# Patient Record
Sex: Male | Born: 1965 | Race: White | Hispanic: No | Smoking: Never smoker
Health system: Southern US, Community
[De-identification: ages and names within clinical notes are randomized; demographics above are authoritative.]

## PROBLEM LIST (undated history)

## (undated) DIAGNOSIS — G2581 Restless legs syndrome: Secondary | ICD-10-CM

## (undated) DIAGNOSIS — M1990 Unspecified osteoarthritis, unspecified site: Secondary | ICD-10-CM

## (undated) DIAGNOSIS — F988 Other specified behavioral and emotional disorders with onset usually occurring in childhood and adolescence: Secondary | ICD-10-CM

## (undated) DIAGNOSIS — G609 Hereditary and idiopathic neuropathy, unspecified: Secondary | ICD-10-CM

## (undated) DIAGNOSIS — I10 Essential (primary) hypertension: Secondary | ICD-10-CM

## (undated) DIAGNOSIS — I498 Other specified cardiac arrhythmias: Secondary | ICD-10-CM

## (undated) DIAGNOSIS — R748 Abnormal levels of other serum enzymes: Secondary | ICD-10-CM

## (undated) DIAGNOSIS — K219 Gastro-esophageal reflux disease without esophagitis: Secondary | ICD-10-CM

## (undated) DIAGNOSIS — M797 Fibromyalgia: Secondary | ICD-10-CM

## (undated) DIAGNOSIS — F32A Depression, unspecified: Secondary | ICD-10-CM

## (undated) DIAGNOSIS — I471 Supraventricular tachycardia, unspecified: Secondary | ICD-10-CM

## (undated) DIAGNOSIS — Z800 Family history of malignant neoplasm of digestive organs: Secondary | ICD-10-CM

## (undated) DIAGNOSIS — R002 Palpitations: Secondary | ICD-10-CM

## (undated) DIAGNOSIS — K589 Irritable bowel syndrome without diarrhea: Secondary | ICD-10-CM

## (undated) DIAGNOSIS — E291 Testicular hypofunction: Secondary | ICD-10-CM

## (undated) DIAGNOSIS — G43909 Migraine, unspecified, not intractable, without status migrainosus: Secondary | ICD-10-CM

## (undated) DIAGNOSIS — G4700 Insomnia, unspecified: Secondary | ICD-10-CM

## (undated) DIAGNOSIS — E785 Hyperlipidemia, unspecified: Secondary | ICD-10-CM

## (undated) DIAGNOSIS — M549 Dorsalgia, unspecified: Secondary | ICD-10-CM

## (undated) HISTORY — DX: Abnormal levels of other serum enzymes: R74.8

## (undated) HISTORY — PX: LIPOMA RESECTION: SHX23

## (undated) HISTORY — DX: Dorsalgia, unspecified: M54.9

## (undated) HISTORY — DX: Migraine, unspecified, not intractable, without status migrainosus: G43.909

## (undated) HISTORY — DX: Hyperlipidemia, unspecified: E78.5

## (undated) HISTORY — DX: Palpitations: R00.2

## (undated) HISTORY — DX: Fibromyalgia: M79.7

## (undated) HISTORY — DX: Depression, unspecified: F32.A

## (undated) HISTORY — DX: Family history of malignant neoplasm of digestive organs: Z80.0

## (undated) HISTORY — DX: Unspecified osteoarthritis, unspecified site: M19.90

## (undated) HISTORY — DX: Hereditary and idiopathic neuropathy, unspecified: G60.9

## (undated) HISTORY — DX: Testicular hypofunction: E29.1

## (undated) HISTORY — DX: Gastro-esophageal reflux disease without esophagitis: K21.9

## (undated) HISTORY — DX: Supraventricular tachycardia: I47.1

## (undated) HISTORY — DX: Supraventricular tachycardia, unspecified: I47.10

## (undated) HISTORY — DX: Restless legs syndrome: G25.81

## (undated) HISTORY — DX: Essential (primary) hypertension: I10

## (undated) HISTORY — DX: Other specified behavioral and emotional disorders with onset usually occurring in childhood and adolescence: F98.8

## (undated) HISTORY — DX: Other specified cardiac arrhythmias: I49.8

## (undated) HISTORY — DX: Irritable bowel syndrome, unspecified: K58.9

## (undated) HISTORY — DX: Insomnia, unspecified: G47.00

---

## 1999-11-26 ENCOUNTER — Ambulatory Visit (HOSPITAL_COMMUNITY): Admission: RE | Admit: 1999-11-26 | Discharge: 1999-11-26 | Payer: Self-pay | Admitting: Surgery

## 2009-08-10 HISTORY — PX: COLONOSCOPY: SHX174

## 2017-04-24 HISTORY — PX: COLONOSCOPY: SHX174

## 2018-05-15 ENCOUNTER — Ambulatory Visit (INDEPENDENT_AMBULATORY_CARE_PROVIDER_SITE_OTHER)
Admission: RE | Admit: 2018-05-15 | Discharge: 2018-05-15 | Disposition: A | Discharge status: Home / Self Care | Payer: 59 | Source: Ambulatory Visit | Attending: Pulmonary Disease | Admitting: Pulmonary Disease

## 2018-05-15 ENCOUNTER — Ambulatory Visit (INDEPENDENT_AMBULATORY_CARE_PROVIDER_SITE_OTHER): Payer: 59 | Admitting: Pulmonary Disease

## 2018-05-15 ENCOUNTER — Encounter: Payer: Self-pay | Admitting: Pulmonary Disease

## 2018-05-15 VITALS — BP 124/80 | HR 81 | Ht 72.0 in | Wt 243.4 lb

## 2018-05-15 DIAGNOSIS — R05 Cough: Secondary | ICD-10-CM

## 2018-05-15 DIAGNOSIS — R053 Chronic cough: Secondary | ICD-10-CM

## 2018-05-15 MED ORDER — HYDROCODONE-HOMATROPINE 5-1.5 MG/5ML PO SYRP: 5.0000 mL | ORAL_SOLUTION | Freq: Four times a day (QID) | ORAL | 0 refills | Status: DC | PRN

## 2018-05-15 MED ORDER — ONDANSETRON HCL 4 MG PO TABS: 4.0000 mg | ORAL_TABLET | Freq: Three times a day (TID) | ORAL | 0 refills | Status: DC | PRN

## 2018-05-15 NOTE — Patient Instructions (Addendum)
Chronic Cough -CXR today -STOP Lisinopril x 1 month -Follow-up with PCP in 2 weeks for blood pressure check -We will need to schedule Pulmonary Function Test. Please contact our office for the best date for your work schedule -Cough syrup as needed -Zofran as needed for nausea related to cough syrup  Allergies -Take Flonase 1 spray per nare daily

## 2018-05-15 NOTE — Progress Notes (Signed)
Subjective:   PATIENT ID: Jack Lawson GENDER: male DOB: 03-Sep-1965, MRN: [id number]   HPI  Chief Complaint  Patient presents with  . Consult    Consult from Dr. Nedra Hai for chronic cough. He states he had bronchitis 1 year ago, 5 rounds of antibiotics and the cough has remained. States he does have some throat irritation and has occassional mucous production.     Reason for Visit: New consult for chronic cough  Mr. Linvel Schwenker is a 53 year old male never smoker with atrial arrhythmias, hypertension, migraine who presents for chronic cough.  He has had chronic cough for over a year and has been treated with multiple rounds of antibiotics (at least 5) without improvement. He initially had symptoms of bronchitis. His cough is mainly dry and hacking in character and nonproductive and associated with sore throat. He feels an internal irritation that stimulates his cough. He noticed after laughing he would have severe coughing fits. In the last few months cough will occur without laughing. When laying down, cough will worsen. It interferes with his sleep. Has tried over the counter medications and tessalon perles. Triggered by cold air, activity, bending over, laying over. Symptoms only improve with time. Reports allergies all year-round and taking claritin. Has post nasal drip. Reports reflux which is well-controlled on omeprazole daily. Denies chest tightness and wheezing.   Hx of DVT/PE in mom and aunts.   Reviewed Cardiology 02/17/17: Followed for palpitations and started on Flecainide. 48Hr Holter 06/10/17: Focal atrial tachycardia  Social History: Never smoker. Second hand smoke during childhood.    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 Stopped 1 year ago Home is 52 years old  I have personally reviewed patient's past medical/family/social history, allergies, current medications.  Past Medical History:  Diagnosis Date  . Atrial  arrhythmia   . HTN (hypertension)   . Hyperlipidemia      Family History  Problem Relation Age of Onset  . Deep vein thrombosis Mother   . Deep vein thrombosis Maternal Aunt      Social History   Occupational History  . Not on file  Tobacco Use  . Smoking status: Never Smoker  . Smokeless tobacco: Never Used  Substance and Sexual Activity  . Alcohol use: Not on file  . Drug use: Not on file  . Sexual activity: Not on file    Allergies  Allergen Reactions  . Codeine Nausea And Vomiting     Outpatient Medications Prior to Visit  Medication Sig Dispense Refill  . benzonatate (TESSALON) 200 MG capsule     . buPROPion (WELLBUTRIN XL) 150 MG 24 hr tablet Take by mouth.    . gabapentin (NEURONTIN) 300 MG capsule     . lisinopril (PRINIVIL,ZESTRIL) 10 MG tablet     . Loratadine 10 MG CAPS Take by mouth.    . Melatonin 5 MG TABS Take by mouth.    . meloxicam (MOBIC) 7.5 MG tablet     . Multiple Vitamin (MULTIVITAMIN) tablet Take 1 tablet by mouth daily.    Marland Kitchen omeprazole (PRILOSEC) 40 MG capsule Take by mouth.    . verapamil (CALAN-SR) 180 MG CR tablet TAKE ONE TABLET BY MOUTH EVERY DAY     No facility-administered medications prior to visit.     Review of Systems  Constitutional: Negative for chills, diaphoresis, fever, malaise/fatigue and weight loss.  HENT: Negative for congestion, ear pain and sore throat.   Respiratory: Positive for cough. Negative  for hemoptysis, sputum production, shortness of breath and wheezing.   Cardiovascular: Negative for chest pain, palpitations and leg swelling.  Gastrointestinal: Negative for abdominal pain, heartburn and nausea.  Genitourinary: Negative for frequency.  Musculoskeletal: Negative for joint pain and myalgias.  Skin: Negative for itching and rash.  Neurological: Negative for dizziness, weakness and headaches.  Endo/Heme/Allergies: Positive for environmental allergies. Does not bruise/bleed easily.  Psychiatric/Behavioral:  Negative for depression. The patient is not nervous/anxious.      Objective:   Vitals:   05/15/18 1441  BP: 124/80  Pulse: 81  SpO2: 100%  Weight: 243 lb 6.4 oz (110.4 kg)  Height: 6' (1.829 m)   SpO2: 100 %  Physical Exam: General: Well-appearing, no acute distress HENT: [place], AT, OP clear, MMM Eyes: EOMI, no scleral icterus Respiratory: Clear to auscultation bilaterally.  No crackles, wheezing or rales Cardiovascular: irregularly irregular rhythme, -M/R/G, no JVD GI: BS+, soft, nontender Extremities:-Edema,-tenderness Neuro: AAO x4, CNII-XII grossly intact Skin: Intact, no rashes or bruising Psych: Normal mood, normal affect  Data Reviewed:  Imaging: CXR 05/15/18-No acute infiltrate, effusion or edema.  PFT: None available  Labs: None available  Imaging, labs and tests noted above have been reviewed independently by me.    Assessment & Plan:   Discussion: 53 year old male with chronic cough x1 year refractory to over-the-counter medications and antibiotics.  Currently on lisinopril.  Will consider UACS and uncontrolled reflux.  Will definitively rule out obstructive or restrictive lung process with PFTs.  Chronic cough -CXR today. Reviewed as noted above. Overall unremarkable -STOP Lisinopril x 1 month -Follow-up with PCP in 2 weeks for blood pressure check -We will need to schedule Pulmonary Function Test. Please contact our office for the best date for your work schedule -Cough syrup as needed -Zofran as needed for nausea related to cough syrup  Allergies -Take Flonase 1 spray per nare daily   Health Maintenance Pneumonia Not Due Influenza Due. Will discuss at next visit.  Orders Placed This Encounter  Procedures  . DG Chest 2 View    Standing Status:   Future    Number of Occurrences:   1    Standing Expiration Date:   07/14/2019    Order Specific Question:   Reason for Exam (SYMPTOM  OR DIAGNOSIS REQUIRED)    Answer:   cough    Order Specific  Question:   Preferred imaging location?    Answer:   Internal    Order Specific Question:   Radiology Contrast Protocol - do NOT remove file path    Answer:   [email or web address]  . Pulmonary function test    Standing Status:   Future    Standing Expiration Date:   05/16/2019    Order Specific Question:   Where should this test be performed?    Answer:   [facility]    Order Specific Question:   Full PFT: includes the following: basic spirometry, spirometry pre & post bronchodilator, diffusion capacity (DLCO), lung volumes    Answer:   Full PFT   Meds ordered this encounter  Medications  . HYDROcodone-homatropine (HYCODAN) 5-1.5 MG/5ML syrup    Sig: Take 5 mLs by mouth every 6 (six) hours as needed for cough.    Dispense:  240 mL    Refill:  0  . ondansetron (ZOFRAN) 4 MG tablet    Sig: Take 1 tablet (4 mg total) by mouth every 8 (eight) hours as needed for nausea or vomiting.    Dispense:  20 tablet  Refill:  0    Return in about 1 month (around 06/13/2018).  Merlene Dante Mechele Collin, MD  Pulmonary Critical Care 05/16/2018 2:21 PM  Office Number [phone]

## 2018-05-16 ENCOUNTER — Encounter: Payer: Self-pay | Admitting: Pulmonary Disease

## 2018-05-16 DIAGNOSIS — I498 Other specified cardiac arrhythmias: Secondary | ICD-10-CM | POA: Insufficient documentation

## 2018-05-16 DIAGNOSIS — G43909 Migraine, unspecified, not intractable, without status migrainosus: Secondary | ICD-10-CM | POA: Insufficient documentation

## 2018-05-16 DIAGNOSIS — R05 Cough: Secondary | ICD-10-CM | POA: Insufficient documentation

## 2018-05-16 DIAGNOSIS — I10 Essential (primary) hypertension: Secondary | ICD-10-CM | POA: Insufficient documentation

## 2018-05-16 DIAGNOSIS — R053 Chronic cough: Secondary | ICD-10-CM | POA: Insufficient documentation

## 2018-05-19 ENCOUNTER — Telehealth: Payer: Self-pay

## 2018-05-19 NOTE — Telephone Encounter (Signed)
-----   Message from [name], MD sent at 05/16/2018  2:21 PM EST ----- Regarding: follow-up Schedule follow-up appt in 1 month Jack Lawson

## 2018-05-19 NOTE — Telephone Encounter (Signed)
Attempted to call patient to schedule follow up appointment per JE. I was unable to reach patient. Left message on mobile phone number provided. Called home number provided, unable to reach patient and unable to leave message as phone stated that memory was full. Will call back.

## 2018-05-20 ENCOUNTER — Telehealth: Payer: Self-pay | Admitting: Pulmonary Disease

## 2018-05-20 NOTE — Telephone Encounter (Signed)
Luciano Cutter, MD  P [facility] Triage Pool        Schedule follow-up appt in 1 month  JE   ------------------------------ Spoke with pt. Attempted to schedule the pt for his 1 ROV and PFT. We did not have any available appointments with [name] until later this month. The pt became very angry and stated, "If I knew she was going to never be in the office I would have chosen another physician." Pt requests to be moved to a physician who is going to be here every day.  [name] - please advise if you are okay with the pt switching to [name].  [name] - please advise if you are okay with the pt switching to you.

## 2018-05-20 NOTE — Telephone Encounter (Signed)
ATC, NA and VM full  WCB 

## 2018-05-20 NOTE — Telephone Encounter (Signed)
Please see telephone encounter from 05/20/2018.

## 2018-05-20 NOTE — Telephone Encounter (Signed)
Fine with me but would rec before he sees me make sure has given prior instructions a try most importantly stopping the lisinopril and when returns do so with all active meds in hand including otcs

## 2018-05-21 NOTE — Telephone Encounter (Signed)
That is fine with me.

## 2018-05-21 NOTE — Telephone Encounter (Signed)
Called patient, unable to reach. Unable to leave voicemail as memory was full. Will call back.

## 2018-05-26 NOTE — Telephone Encounter (Signed)
Patient returned call Spoke with patient and appt scheduled with MW on 3.11.2020 @ 1045, new patient 30 min appt Patient would like to have PFT done but no availability prior to appt with MW until later this week PFT scheduled for same day 3.11.2020 @ 1600  Nothing further needed; will sign off

## 2018-05-27 ENCOUNTER — Ambulatory Visit: Payer: 59 | Admitting: Internal Medicine

## 2018-05-31 DIAGNOSIS — E871 Hypo-osmolality and hyponatremia: Secondary | ICD-10-CM | POA: Diagnosis not present

## 2018-05-31 DIAGNOSIS — J189 Pneumonia, unspecified organism: Secondary | ICD-10-CM | POA: Diagnosis not present

## 2018-05-31 DIAGNOSIS — J101 Influenza due to other identified influenza virus with other respiratory manifestations: Secondary | ICD-10-CM | POA: Diagnosis not present

## 2018-05-31 DIAGNOSIS — J9691 Respiratory failure, unspecified with hypoxia: Secondary | ICD-10-CM | POA: Diagnosis not present

## 2018-06-04 ENCOUNTER — Encounter (HOSPITAL_COMMUNITY): Payer: Self-pay | Admitting: Emergency Medicine

## 2018-06-04 ENCOUNTER — Other Ambulatory Visit: Payer: Self-pay

## 2018-06-04 ENCOUNTER — Observation Stay (HOSPITAL_COMMUNITY)
Admission: EM | Admit: 2018-06-04 | Discharge: 2018-06-07 | Disposition: A | Discharge status: Home / Self Care | Payer: 59 | Attending: Family Medicine | Admitting: Family Medicine

## 2018-06-04 ENCOUNTER — Emergency Department (HOSPITAL_COMMUNITY): Payer: 59

## 2018-06-04 DIAGNOSIS — J189 Pneumonia, unspecified organism: Principal | ICD-10-CM | POA: Insufficient documentation

## 2018-06-04 DIAGNOSIS — R590 Localized enlarged lymph nodes: Secondary | ICD-10-CM | POA: Insufficient documentation

## 2018-06-04 DIAGNOSIS — Z79899 Other long term (current) drug therapy: Secondary | ICD-10-CM | POA: Insufficient documentation

## 2018-06-04 DIAGNOSIS — Z885 Allergy status to narcotic agent status: Secondary | ICD-10-CM | POA: Insufficient documentation

## 2018-06-04 DIAGNOSIS — N189 Chronic kidney disease, unspecified: Secondary | ICD-10-CM | POA: Diagnosis not present

## 2018-06-04 DIAGNOSIS — Z8249 Family history of ischemic heart disease and other diseases of the circulatory system: Secondary | ICD-10-CM | POA: Diagnosis not present

## 2018-06-04 DIAGNOSIS — M797 Fibromyalgia: Secondary | ICD-10-CM | POA: Insufficient documentation

## 2018-06-04 DIAGNOSIS — G43709 Chronic migraine without aura, not intractable, without status migrainosus: Secondary | ICD-10-CM

## 2018-06-04 DIAGNOSIS — G43909 Migraine, unspecified, not intractable, without status migrainosus: Secondary | ICD-10-CM | POA: Insufficient documentation

## 2018-06-04 DIAGNOSIS — I129 Hypertensive chronic kidney disease with stage 1 through stage 4 chronic kidney disease, or unspecified chronic kidney disease: Secondary | ICD-10-CM | POA: Insufficient documentation

## 2018-06-04 DIAGNOSIS — G4700 Insomnia, unspecified: Secondary | ICD-10-CM | POA: Diagnosis not present

## 2018-06-04 DIAGNOSIS — R05 Cough: Secondary | ICD-10-CM

## 2018-06-04 DIAGNOSIS — J181 Lobar pneumonia, unspecified organism: Secondary | ICD-10-CM

## 2018-06-04 DIAGNOSIS — Z791 Long term (current) use of non-steroidal anti-inflammatories (NSAID): Secondary | ICD-10-CM | POA: Diagnosis not present

## 2018-06-04 DIAGNOSIS — F329 Major depressive disorder, single episode, unspecified: Secondary | ICD-10-CM | POA: Insufficient documentation

## 2018-06-04 DIAGNOSIS — R000 Tachycardia, unspecified: Secondary | ICD-10-CM | POA: Diagnosis not present

## 2018-06-04 DIAGNOSIS — K219 Gastro-esophageal reflux disease without esophagitis: Secondary | ICD-10-CM | POA: Diagnosis not present

## 2018-06-04 DIAGNOSIS — N179 Acute kidney failure, unspecified: Secondary | ICD-10-CM | POA: Insufficient documentation

## 2018-06-04 LAB — CBC WITH DIFFERENTIAL/PLATELET
Abs Immature Granulocytes: 0.09 10*3/uL — ABNORMAL HIGH (ref 0.00–0.07)
BASOS PCT: 0 %
Basophils Absolute: 0 10*3/uL (ref 0.0–0.1)
EOS ABS: 0.4 10*3/uL (ref 0.0–0.5)
EOS PCT: 3 %
HCT: 39.8 % (ref 39.0–52.0)
Hemoglobin: 12.9 g/dL — ABNORMAL LOW (ref 13.0–17.0)
Immature Granulocytes: 1 %
Lymphocytes Relative: 24 %
Lymphs Abs: 2.8 10*3/uL (ref 0.7–4.0)
MCH: 29.3 pg (ref 26.0–34.0)
MCHC: 32.4 g/dL (ref 30.0–36.0)
MCV: 90.5 fL (ref 80.0–100.0)
Monocytes Absolute: 1.4 10*3/uL — ABNORMAL HIGH (ref 0.1–1.0)
Monocytes Relative: 12 %
Neutro Abs: 7.2 10*3/uL (ref 1.7–7.7)
Neutrophils Relative %: 60 %
Platelets: 287 10*3/uL (ref 150–400)
RBC: 4.4 MIL/uL (ref 4.22–5.81)
RDW: 12.3 % (ref 11.5–15.5)
WBC: 11.9 10*3/uL — ABNORMAL HIGH (ref 4.0–10.5)
nRBC: 0 % (ref 0.0–0.2)

## 2018-06-04 LAB — BASIC METABOLIC PANEL
ANION GAP: 10 (ref 5–15)
BUN: 14 mg/dL (ref 6–20)
CO2: 25 mmol/L (ref 22–32)
Calcium: 8.6 mg/dL — ABNORMAL LOW (ref 8.9–10.3)
Chloride: 100 mmol/L (ref 98–111)
Creatinine, Ser: 1.35 mg/dL — ABNORMAL HIGH (ref 0.61–1.24)
GFR calc Af Amer: >60 mL/min (ref >60)
GFR calc non Af Amer: >60 mL/min (ref >60)
Glucose, Bld: 73 mg/dL (ref 70–99)
POTASSIUM: 4.2 mmol/L (ref 3.5–5.1)
Sodium: 135 mmol/L (ref 135–145)

## 2018-06-04 LAB — BRAIN NATRIURETIC PEPTIDE: B Natriuretic Peptide: 33.1 pg/mL (ref 0.0–100.0)

## 2018-06-04 MED ORDER — BUPROPION HCL ER (XL) 150 MG PO TB24
150.0000 mg | ORAL_TABLET | Freq: Every day | ORAL | Status: DC
  Administered 2018-06-05 – 2018-06-07 (×3): 150 mg via ORAL
  Filled 2018-06-04 (×3): qty 1

## 2018-06-04 MED ORDER — BENZONATATE 100 MG PO CAPS
200.0000 mg | ORAL_CAPSULE | Freq: Three times a day (TID) | ORAL | Status: DC
  Filled 2018-06-04 (×2): qty 2

## 2018-06-04 MED ORDER — GABAPENTIN 300 MG PO CAPS
300.0000 mg | ORAL_CAPSULE | Freq: Two times a day (BID) | ORAL | Status: DC
  Administered 2018-06-04 – 2018-06-07 (×5): 300 mg via ORAL
  Filled 2018-06-04 (×5): qty 1

## 2018-06-04 MED ORDER — MELATONIN 3 MG PO TABS
4.5000 mg | ORAL_TABLET | Freq: Every evening | ORAL | Status: DC | PRN
  Filled 2018-06-04: qty 1.5

## 2018-06-04 MED ORDER — SODIUM CHLORIDE 0.9 % IV SOLN: 500.0000 mg | INTRAVENOUS | Status: DC

## 2018-06-04 MED ORDER — PANTOPRAZOLE SODIUM 40 MG PO TBEC
40.0000 mg | DELAYED_RELEASE_TABLET | Freq: Every day | ORAL | Status: DC
  Administered 2018-06-05 – 2018-06-07 (×3): 40 mg via ORAL
  Filled 2018-06-04 (×4): qty 1

## 2018-06-04 MED ORDER — ENOXAPARIN SODIUM 40 MG/0.4ML ~~LOC~~ SOLN
40.0000 mg | SUBCUTANEOUS | Status: DC
  Administered 2018-06-04 – 2018-06-06 (×3): 40 mg via SUBCUTANEOUS
  Filled 2018-06-04 (×3): qty 0.4

## 2018-06-04 MED ORDER — ONDANSETRON HCL 4 MG PO TABS
4.0000 mg | ORAL_TABLET | Freq: Three times a day (TID) | ORAL | Status: DC | PRN
  Administered 2018-06-05 – 2018-06-07 (×4): 4 mg via ORAL
  Filled 2018-06-04 (×4): qty 1

## 2018-06-04 MED ORDER — SODIUM CHLORIDE 0.9 % IV SOLN
500.0000 mg | Freq: Once | INTRAVENOUS | Status: AC
  Administered 2018-06-04: 500 mg via INTRAVENOUS
  Filled 2018-06-04: qty 500

## 2018-06-04 MED ORDER — SODIUM CHLORIDE 0.9 % IV SOLN: 1.0000 g | INTRAVENOUS | Status: DC

## 2018-06-04 MED ORDER — NAPROXEN 250 MG PO TABS
250.0000 mg | ORAL_TABLET | Freq: Once | ORAL | Status: AC
  Administered 2018-06-04: 250 mg via ORAL
  Filled 2018-06-04: qty 1

## 2018-06-04 MED ORDER — TRAZODONE HCL 100 MG PO TABS
100.0000 mg | ORAL_TABLET | Freq: Every day | ORAL | Status: DC
  Administered 2018-06-04 – 2018-06-06 (×3): 100 mg via ORAL
  Filled 2018-06-04 (×3): qty 1

## 2018-06-04 MED ORDER — FUROSEMIDE 10 MG/ML IJ SOLN
20.0000 mg | Freq: Once | INTRAMUSCULAR | Status: AC
  Administered 2018-06-04: 20 mg via INTRAVENOUS
  Filled 2018-06-04: qty 2

## 2018-06-04 MED ORDER — VERAPAMIL HCL ER 180 MG PO TBCR
180.0000 mg | EXTENDED_RELEASE_TABLET | Freq: Every day | ORAL | Status: DC
  Administered 2018-06-05 – 2018-06-07 (×3): 180 mg via ORAL
  Filled 2018-06-04 (×3): qty 1

## 2018-06-04 MED ORDER — SODIUM CHLORIDE 0.9 % IV SOLN
1.0000 g | Freq: Once | INTRAVENOUS | Status: AC
  Administered 2018-06-04: 1 g via INTRAVENOUS
  Filled 2018-06-04: qty 10

## 2018-06-04 MED ORDER — LORATADINE 10 MG PO TABS
10.0000 mg | ORAL_TABLET | Freq: Every day | ORAL | Status: DC
  Administered 2018-06-05 – 2018-06-07 (×3): 10 mg via ORAL
  Filled 2018-06-04 (×3): qty 1

## 2018-06-04 MED ORDER — HYDROCODONE-HOMATROPINE 5-1.5 MG/5ML PO SYRP
5.0000 mL | ORAL_SOLUTION | Freq: Four times a day (QID) | ORAL | Status: DC | PRN
  Administered 2018-06-04: 5 mL via ORAL
  Filled 2018-06-04: qty 5

## 2018-06-04 NOTE — ED Notes (Signed)
ED TO INPATIENT HANDOFF REPORT  ED Nurse Name and Phone #: [phone]  S Name/Age/Gender Jack Lawson 53 y.o. male Room/Bed: 038C/038C  Code Status   Code Status: Not on file  Home/SNF/Other Home Patient oriented to: self, place, time and situation Is this baseline? Yes   Triage Complete: Triage complete  Chief Complaint Pneumonia  Triage Note Pt states he was recently treated for the flu. Pt also was diagnosed with PNA. Pt was quarantined and tested for coronavirus which was negative. Pt went back to [facility] and was told the PNA was in both lungs. Pt presents to ED today for worsening PNA.    Allergies Allergies  Allergen Reactions  . Codeine Nausea And Vomiting    Level of Care/Admitting Diagnosis ED Disposition    ED Disposition Condition Comment   Admit  Hospital Area: [facility] [[id number]]  Level of Care: Med-Surg [16]  Diagnosis: Pneumonia [227785]  Admitting Physician: Moses Manners [[id number]]  Attending Physician: Moses Manners [[id number]]  Estimated length of stay: past midnight tomorrow  Certification:: I certify this patient will need inpatient services for at least 2 midnights  PT Class (Do Not Modify): Inpatient [101]  PT Acc Code (Do Not Modify): Private [1]       B Medical/Surgery History Past Medical History:  Diagnosis Date  . Atrial arrhythmia   . HTN (hypertension)   . Hyperlipidemia    History reviewed. No pertinent surgical history.   A IV Location/Drains/Wounds Patient Lines/Drains/Airways Status   Active Line/Drains/Airways    Name:   Placement date:   Placement time:   Site:   Days:   Peripheral IV 06/04/18 Right Forearm   06/04/18    1734    Forearm   less than 1          Intake/Output Last 24 hours No intake or output data in the 24 hours ending 06/04/18 1954  Labs/Imaging Results for orders placed or performed during the hospital encounter of 06/04/18 (from the past 48 hour(s))  CBC with Differential      Status: Abnormal   Collection Time: 06/04/18  6:37 PM  Result Value Ref Range   WBC 11.9 (H) 4.0 - 10.5 K/uL   RBC 4.40 4.22 - 5.81 MIL/uL   Hemoglobin 12.9 (L) 13.0 - 17.0 g/dL   HCT 76.7 34.1 - 93.7 %   MCV 90.5 80.0 - 100.0 fL   MCH 29.3 26.0 - 34.0 pg   MCHC 32.4 30.0 - 36.0 g/dL   RDW 90.2 40.9 - 73.5 %   Platelets 287 150 - 400 K/uL   nRBC 0.0 0.0 - 0.2 %   Neutrophils Relative % 60 %   Neutro Abs 7.2 1.7 - 7.7 K/uL   Lymphocytes Relative 24 %   Lymphs Abs 2.8 0.7 - 4.0 K/uL   Monocytes Relative 12 %   Monocytes Absolute 1.4 (H) 0.1 - 1.0 K/uL   Eosinophils Relative 3 %   Eosinophils Absolute 0.4 0.0 - 0.5 K/uL   Basophils Relative 0 %   Basophils Absolute 0.0 0.0 - 0.1 K/uL   Immature Granulocytes 1 %   Abs Immature Granulocytes 0.09 (H) 0.00 - 0.07 K/uL    Comment: Performed at [facility], [address] [place], [place] [address]  Basic metabolic panel     Status: Abnormal   Collection Time: 06/04/18  6:37 PM  Result Value Ref Range   Sodium 135 135 - 145 mmol/L   Potassium 4.2  3.5 - 5.1 mmol/L   Chloride 100 98 - 111 mmol/L   CO2 25 22 - 32 mmol/L   Glucose, Bld 73 70 - 99 mg/dL   BUN 14 6 - 20 mg/dL   Creatinine, Ser 5.62 (H) 0.61 - 1.24 mg/dL   Calcium 8.6 (L) 8.9 - 10.3 mg/dL   GFR calc non Af Amer >60 >60 mL/min   GFR calc Af Amer >60 >60 mL/min   Anion gap 10 5 - 15    Comment: Performed at [facility], [address], [place], [place] [address]   Dg Chest Portable 1 View  Result Date: 06/04/2018 CLINICAL DATA:  Question pneumonia, prior abnormal radiograph EXAM: PORTABLE CHEST 1 VIEW COMPARISON:  05/31/2018 FINDINGS: Patchy interstitial and alveolar infiltrate at the left base. Stable cardiomegaly central vascular congestion. No pneumothorax. IMPRESSION: 1. Cardiomegaly with vascular congestion. 2. Patchy interstitial and alveolar infiltrate at the left base Electronically Signed   By: Jasmine Pang M.D.   On: 06/04/2018 18:17    Pending  Labs Unresulted Labs (From admission, onward)    Start     Ordered   06/04/18 1842  Brain natriuretic peptide  Once,   R     06/04/18 1841          Vitals/Pain Today's Vitals   06/04/18 1727 06/04/18 1745 06/04/18 1930 06/04/18 1930  BP: (!) 154/114 125/82    Pulse:  98 (!) 105   Resp: (!) 21 (!) 21 (!) 25   Temp: 98.9 F (37.2 C)     TempSrc: Oral     SpO2: 98% 98% 97%   Weight:      Height:      PainSc:    0-No pain    Isolation Precautions No active isolations  Medications Medications  azithromycin (ZITHROMAX) 500 mg in sodium chloride 0.9 % 250 mL IVPB (500 mg Intravenous New Bag/Given 06/04/18 1939)  cefTRIAXone (ROCEPHIN) 1 g in sodium chloride 0.9 % 100 mL IVPB (0 g Intravenous Stopped 06/04/18 1930)    Mobility walks Low fall risk   Focused Assessments Pulmonary Assessment Handoff:  Lung sounds:   O2 Device: Room Air        R Recommendations: See Admitting Provider Note  Report given to:   Additional Notes:

## 2018-06-04 NOTE — H&P (Addendum)
Family Medicine Teaching [facility] Admission History and Physical Service Pager: [phone]  Patient name: Jack Lawson Medical record number: [id number] Date of birth: 09-Nov-1965 Age: 53 y.o. Gender: male  Primary Care Provider: [name], MD Consultants: None Code Status: Full  Chief Complaint: Cough, shortness of breath, worsening pneumonia  Assessment and Plan: Jack Lawson is a 52 y.o. male presenting with shortness of breath and cough. PMH is significant for migraines, sinus tachycardia, chronic cough, depression, and fibromyalgia.  Shortness of breath and Cough: Patient with history of unresolved shortness of breath and dry cough in setting of pneumonia (s/p outpatient antibiotic treatment with doxycycline and levaquin), recent positive flu swab (3/10), and new onset lower extremity swelling.  In the ED patient is satting well on room air but appears uncomfortable due to work of breathing.  Minimal crackles can be auscultated in the bilateral lung bases left greater than right, but most notably he has bilateral lower extremity 2+ pitting edema to the knees. He is afebrile, but has leukocytosis at 11.9.  Chest x-ray on admission showing cardiomegaly with vascular congestion and patchy interstitial and alveolar infiltrate at the left base.  The patient's shortness of breath and cough could be due to worsening pneumonia unresponsive to prior antibiotic treatments.  Cough is nonproductive. May have a component of bronchitis. Due to the lower extremity swelling a new onset CHF cannot be excluded, however, BNP is normal at 33.1 on admission.  PE is unlikely as well score is only 1.5 (due to patient's chronic tachycardia), however the patient does have a family history of DVTs.  No wheezes were appreciated and the patient does not have a smoking history so new onset COPD versus asthma are unlikely.  COVID testing 3/15 was negative and the patient has other explanations for his symptoms  (pneumonia for fever, lisinopril for chronic cough). -Admit to med-surg, attending Dr. Pollie Meyer -Continue IV azithromycin and ceftriaxone -Furosemide injection 20 mg IV x1 -consider chest CT if lack of improvement -Consider steroid if no improvement -A.m. echo -A.m. CBC, BMP -Follow-up strep pneumoniae urinary antigen -Up with assistance -Vital signs per routine -Zofran PRN nausea  AKI: Patient with creatinine 1.35 on admission, BUN within normal limits of 14 and GFR greater than 60. -Monitor with a.m. BMP - patient receiving Lasix -Avoid nephrotoxic meds  Hypertension: Patient has a history of hypertension for which he used to take lisinopril; however, lisinopril was discontinued in light of cough. Blood pressures on admission 125/82 up to 154/114. -Continue to monitor blood pressure with vitals -Blood pressures may reduce with Lasix IV -Consider adding on losartan for high blood pressure  Sinus tachycardia: no underlying cause, was diagnosed 20 years ago, takes verapamil 180 mg daily. -Continue home verapamil 180 mg daily -echo as above  Chronic cough: had for 1 year, recently stopped lisinopril March 1st. Cough continued but was getting sick at this time and diagnosed with flu. Tested negative for COVID. Has a history of GERD on PPI. No wheezing on exam. -Tessalon Perles 20 mg TID for cough -Hydrocodone-homatropine q6 hours PRN cough -Continue home Loratadine 10 mg -continue PPI  Depression: sees primary care physician, takes Wellbutrin 150 mg daily.  Does not see psychiatrist/psychologist/counselor. -Continue Wellbutrin 150 mg daily  Migraines: last had migraine 6 months ago, normally takes aleve PRN -Naproxen as needed for migraines  Fibromyalgia: diagnosed 3 Weeks ago, prescribed gabapentin 300 mg twice daily for pain -Continue gabapentin  Insomnia: patient takes melatonin 10 mg each night for sleep.  Reports poor sleep since getting sick and requests stronger sleep  medication -Continue melatonin PRN -Trazodone 100 mg at bedtime for sleep  GERD: Patient has a history of such, might be contributing to chronic cough.  Takes omeprazole at home. -Protonix 40 mg daily  FEN/GI: Heart healthy diet Prophylaxis: Lovenox  Disposition: Admit to med-surg  History of Present Illness:  Jack Lawson is a 53 y.o. male presenting with shortness of breath, dry cough, and tiredness, due to PNA.  He has a history of chronic cough possibly secondary to lisinopril use for hypertension.  At the end of February he had flown from [place] to [place] and back, then presented to a pulmonologist 2/28 to be worked up for (chonic) cough. Lisinopril was stopped. There were plans to do PFTs but patient became ill. He tested positive for flu 3/10 and started Tamiflu 3/12. He was also prescribed doxycycline for flu/pneumonia.  On 3/14, without improvement of his symptoms, the patient went to the [facility] emergency department for about 12 hours, at which point chest x-ray was positive for pneumonia, he was given 1 dose of IV azithromycin, doxycycline was stopped, the patient was sent home on levofloxacin. He was tested for covid-19 at this ED visit which was negative. The patient symptoms continue to get worse and the patient decided to come to the [facility], ED for continued shortness of breath and cough. He reports he cannot lay flat, he has to sit upright in a recliner to sleep. His leg swelling started 3-4 days ago. He has had normal urinary output. His cough is non-productive. He has some minor improvement with codeine cough syrup.   Review Of Systems: Per HPI with the following additions:  Review of Systems  Constitutional: Positive for fever and malaise/fatigue.  Respiratory: Positive for cough and shortness of breath. Negative for sputum production and wheezing.   Cardiovascular: Positive for leg swelling (2+ pitting). Negative for chest pain and palpitations.   Gastrointestinal: Negative for abdominal pain, nausea and vomiting.  Genitourinary: Negative for dysuria.   Patient Active Problem List   Diagnosis Date Noted  . Pneumonia 06/04/2018  . Atrial arrhythmia 05/16/2018  . Hypertension 05/16/2018  . Migraine 05/16/2018  . Chronic cough 05/16/2018   Past Medical History: Past Medical History:  Diagnosis Date  . Atrial arrhythmia   . HTN (hypertension)   . Hyperlipidemia    Past Surgical History: History reviewed. No pertinent surgical history.  Social History: Social History   Tobacco Use  . Smoking status: Never Smoker  . Smokeless tobacco: Never Used  Substance Use Topics  . Alcohol use: Not Currently  . Drug use: Never   Additional social history: None  Please also refer to relevant sections of EMR.  Family History: Family History  Problem Relation Age of Onset  . Deep vein thrombosis Mother   . Deep vein thrombosis Maternal Aunt   Uncle - Diabetes HTN - Everyone on mom's side  Allergies and Medications: Allergies  Allergen Reactions  . Codeine Nausea And Vomiting   No current facility-administered medications on file prior to encounter.    Current Outpatient Medications on File Prior to Encounter  Medication Sig Dispense Refill  . benzonatate (TESSALON) 200 MG capsule     . buPROPion (WELLBUTRIN XL) 150 MG 24 hr tablet Take by mouth.    . gabapentin (NEURONTIN) 300 MG capsule     . HYDROcodone-homatropine (HYCODAN) 5-1.5 MG/5ML syrup Take 5 mLs by mouth every 6 (six) hours as needed  for cough. 240 mL 0  . lisinopril (PRINIVIL,ZESTRIL) 10 MG tablet     . Loratadine 10 MG CAPS Take by mouth.    . Melatonin 5 MG TABS Take by mouth.    . meloxicam (MOBIC) 7.5 MG tablet     . Multiple Vitamin (MULTIVITAMIN) tablet Take 1 tablet by mouth daily.    Marland Kitchen omeprazole (PRILOSEC) 40 MG capsule Take by mouth.    . ondansetron (ZOFRAN) 4 MG tablet Take 1 tablet (4 mg total) by mouth every 8 (eight) hours as needed for  nausea or vomiting. 20 tablet 0  . verapamil (CALAN-SR) 180 MG CR tablet TAKE ONE TABLET BY MOUTH EVERY DAY     Objective: BP 125/82   Pulse (!) 105   Temp 98.9 F (37.2 C) (Oral)   Resp (!) 25   Ht 6' (1.829 m)   Wt 108.9 kg   SpO2 97%   BMI 32.55 kg/m  Physical Exam HENT:     Nose: No congestion.  Neck:     Musculoskeletal: Normal range of motion.  Cardiovascular:     Rate and Rhythm: Regular rhythm. Tachycardia present.     Pulses: Normal pulses.     Heart sounds: Normal heart sounds.  Pulmonary:     Effort: No respiratory distress.     Breath sounds: No wheezing.     Comments: Bilateral lower lobe crackles Chest:     Chest wall: No tenderness.  Abdominal:     General: Bowel sounds are normal.  Musculoskeletal:        General: Swelling (2+ bilateral pitting up to knees) present.     Right lower leg: Edema present.     Left lower leg: Edema present.  Lymphadenopathy:     Cervical: No cervical adenopathy.  Neurological:     General: No focal deficit present.     Mental Status: He is alert.   Labs and Imaging: CBC BMET  Recent Labs  Lab 06/04/18 1837  WBC 11.9*  HGB 12.9*  HCT 39.8  PLT 287   Recent Labs  Lab 06/04/18 1837  NA 135  K 4.2  CL 100  CO2 25  BUN 14  CREATININE 1.35*  GLUCOSE 73  CALCIUM 8.6*     BNP: 33.1 wnl Strep pneumoniae urine antigen: Pending  Dg Chest Portable 1 View: 06/04/2018 CLINICAL DATA:  Question pneumonia, prior abnormal radiograph EXAM: PORTABLE CHEST 1 VIEW COMPARISON:  05/31/2018 FINDINGS: Patchy interstitial and alveolar infiltrate at the left base. Stable cardiomegaly central vascular congestion. No pneumothorax. IMPRESSION: 1. Cardiomegaly with vascular congestion. 2. Patchy interstitial and alveolar infiltrate at the left base.  Dollene Cleveland, DO 06/04/2018, 7:44 PM PGY-1, [facility] FPTS Intern pager: [phone], text pages welcome   FPTS Upper-Level Resident Addendum   I have  independently interviewed and examined the patient. I have discussed the above with the original author and agree with their documentation. My edits for correction/addition/clarification are in pink. Please see also any attending notes.    Dolores Patty, DO PGY-3, [facility] FPTS Service pager: [phone] (text pages welcome through AMION)

## 2018-06-04 NOTE — ED Notes (Addendum)
Spoke with DO Dareen Piano, who states they are not testing COVID-19 at this time.

## 2018-06-04 NOTE — ED Triage Notes (Signed)
Pt states he was recently treated for the flu. Pt also was diagnosed with PNA. Pt was quarantined and tested for coronavirus which was negative. Pt went back to MD and was told the PNA was in both lungs. Pt presents to ED today for worsening PNA.

## 2018-06-04 NOTE — ED Notes (Signed)
RN attempted to call report 

## 2018-06-04 NOTE — ED Provider Notes (Signed)
[facility] EMERGENCY DEPARTMENT Provider Note   CSN: [id number] Arrival date & time: 06/04/18  1712    History   Chief Complaint Chief Complaint  Patient presents with  . Pneumonia    HPI Jack Lawson is a 53 y.o. male presents today for concern about outpatient treatment of community-acquired pneumonia.  Patient reports that on May 26, 2018 he developed fever and cough.  He tested positive for influenza as well as chest x-ray concerning for pneumonia.  He started on antibiotics, symptoms continue to worsen and patient found to now have bilateral lower lobe pneumonia on chest x-ray at Urgent Care.  Patient presents today for further evaluation and treatment.  Patient reports that 3 weeks ago he traveled via [employer] to [place].  Patient was tested on Sunday for novel COVID-19 virus by [facility], informed today that his testing was negative. - On arrival patient reports intermittent fever, cough productive with yellow sputum and intermittent shortness of breath only with cough.  Patient concerned with bilateral lower leg swelling.  States this is a new problem for him over the past 5 days.  No injury or color change, no associated leg pain.  No history of CHF.   Personal protective equipment was worn throughout visit    HPI  Past Medical History:  Diagnosis Date  . Atrial arrhythmia   . HTN (hypertension)   . Hyperlipidemia     Patient Active Problem List   Diagnosis Date Noted  . Atrial arrhythmia 05/16/2018  . Hypertension 05/16/2018  . Migraine 05/16/2018  . Chronic cough 05/16/2018    History reviewed. No pertinent surgical history.      Home Medications    Prior to Admission medications   Medication Sig Start Date End Date Taking? Authorizing Provider  benzonatate (TESSALON) 200 MG capsule  05/05/18   [provider]  buPROPion (WELLBUTRIN XL) 150 MG 24 hr tablet Take by mouth. 10/19/15   [provider]  gabapentin (NEURONTIN) 300 MG capsule  05/05/18   [provider]  HYDROcodone-homatropine (HYCODAN) 5-1.5 MG/5ML syrup Take 5 mLs by mouth every 6 (six) hours as needed for cough. 05/15/18   Luciano Cutter, MD  lisinopril (PRINIVIL,ZESTRIL) 10 MG tablet  03/23/18   [provider]  Loratadine 10 MG CAPS Take by mouth.    [provider]  Melatonin 5 MG TABS Take by mouth.    [provider]  meloxicam (MOBIC) 7.5 MG tablet  04/20/18   [provider]  Multiple Vitamin (MULTIVITAMIN) tablet Take 1 tablet by mouth daily.    [provider]  omeprazole (PRILOSEC) 40 MG capsule Take by mouth. 09/22/15   [provider]  ondansetron (ZOFRAN) 4 MG tablet Take 1 tablet (4 mg total) by mouth every 8 (eight) hours as needed for nausea or vomiting. 05/15/18   Luciano Cutter, MD  verapamil (CALAN-SR) 180 MG CR tablet TAKE ONE TABLET BY MOUTH EVERY DAY 05/15/18   [provider]    Family History Family History  Problem Relation Age of Onset  . Deep vein thrombosis Mother   . Deep vein thrombosis Maternal Aunt     Social History Social History   Tobacco Use  . Smoking status: Never Smoker  . Smokeless tobacco: Never Used  Substance Use Topics  . Alcohol use: Not Currently  . Drug use: Never     Allergies   Codeine   Review of Systems Review of Systems  Constitutional: Positive for chills and fever.  Respiratory: Positive for cough and shortness of breath (Only with cough fits).   Cardiovascular: Positive for leg swelling. Negative for chest pain and palpitations.  Gastrointestinal: Positive for nausea. Negative for abdominal pain, diarrhea and vomiting.  Musculoskeletal: Negative.  Negative for arthralgias, myalgias, neck pain and neck stiffness.  Skin: Negative.  Negative for color change and wound.  Neurological: Negative.  Negative for weakness and headaches.  All other systems reviewed and are  negative.  Physical Exam Updated Vital Signs BP 125/82   Pulse 98   Temp 98.9 F (37.2 C) (Oral)   Resp (!) 21   Ht 6' (1.829 m)   Wt 108.9 kg   SpO2 98%   BMI 32.55 kg/m   Physical Exam Constitutional:      General: He is not in acute distress.    Appearance: Normal appearance. He is well-developed. He is obese. He is not ill-appearing or diaphoretic.  HENT:     Head: Normocephalic and atraumatic.     Right Ear: External ear normal.     Left Ear: External ear normal.     Nose: Nose normal.     Mouth/Throat:     Mouth: Mucous membranes are moist.     Pharynx: Oropharynx is clear.  Eyes:     General: Vision grossly intact. Gaze aligned appropriately.     Pupils: Pupils are equal, round, and reactive to light.  Neck:     Musculoskeletal: Normal range of motion.     Trachea: Trachea and phonation normal. No tracheal deviation.  Cardiovascular:     Rate and Rhythm: Normal rate and regular rhythm.     Pulses:          Dorsalis pedis pulses are 2+ on the right side and 2+ on the left side.       Posterior tibial pulses are 2+ on the right side and 2+ on the left side.     Heart sounds: Normal heart sounds.  Pulmonary:     Effort: Pulmonary effort is normal. No respiratory distress.     Breath sounds: Examination of the left-middle field reveals rhonchi. Examination of the left-lower field reveals rhonchi. Wheezing and rhonchi present.  Chest:     Chest wall: No deformity, tenderness or crepitus.  Abdominal:     General: There is no distension.     Palpations: Abdomen is soft.     Tenderness: There is no abdominal tenderness. There is no guarding or rebound.  Musculoskeletal: Normal range of motion.        General: No tenderness.     Right lower leg: 1+ Edema present.     Left lower leg: 1+ Edema present.  Feet:     Right foot:     Protective Sensation: 5 sites tested. 5 sites sensed.     Left foot:     Protective Sensation: 5 sites tested. 5 sites sensed.  Skin:     General: Skin is warm and dry.     Capillary Refill: Capillary refill takes less than 2 seconds.  Neurological:     General: No focal deficit present.     Mental Status: He is alert.     GCS: GCS eye subscore is 4. GCS verbal subscore is 5. GCS motor subscore is 6.     Comments: Speech is clear and goal oriented, follows commands Major Cranial nerves without deficit, no facial droop Moves extremities without ataxia, coordination intact  Psychiatric:  Mood and Affect: Mood normal.        Behavior: Behavior normal.      ED Treatments / Results  Labs (all labs ordered are listed, but only abnormal results are displayed) Labs Reviewed  CBC WITH DIFFERENTIAL/PLATELET  BASIC METABOLIC PANEL    EKG None  Radiology Dg Chest Portable 1 View  Result Date: 06/04/2018 CLINICAL DATA:  Question pneumonia, prior abnormal radiograph EXAM: PORTABLE CHEST 1 VIEW COMPARISON:  05/31/2018 FINDINGS: Patchy interstitial and alveolar infiltrate at the left base. Stable cardiomegaly central vascular congestion. No pneumothorax. IMPRESSION: 1. Cardiomegaly with vascular congestion. 2. Patchy interstitial and alveolar infiltrate at the left base Electronically Signed   By: Jasmine Pang M.D.   On: 06/04/2018 18:17    Procedures Procedures (including critical care time)  Medications Ordered in ED Medications  cefTRIAXone (ROCEPHIN) 1 g in sodium chloride 0.9 % 100 mL IVPB (has no administration in time range)  azithromycin (ZITHROMAX) 500 mg in sodium chloride 0.9 % 250 mL IVPB (has no administration in time range)     Initial Impression / Assessment and Plan / ED Course  I have reviewed the triage vital signs and the nursing notes.  Pertinent labs & imaging results that were available during my care of the patient were reviewed by me and considered in my medical decision making (see chart for details).  Clinical Course as of Jun 04 2003  Thu Jun 04, 2018  [id number]   [BM]  1845  Discussed case with family medicine will see patient in ED for evaluation.   [BM]    Clinical Course User Index [BM] Harlene Salts A, PA-C      CBC with leukocytosis BMP with mildly elevated creatinine BNP within normal limits CXR:  IMPRESSION:  1. Cardiomegaly with vascular congestion.  2. Patchy interstitial and alveolar infiltrate at the left base  - Vital signs stable, mildly tachycardic.  Lung examination concerning for left lower rhonchi, mild bilateral wheezing.  Patient to be admitted for failed outpatient treatment of community-acquired pneumonia.  Azithromycin and Rocephin ordered. - Patient with negative COVID-19 test today, attempted to contact number and clinical course to discuss this testing with [facility] health, no answer. - Discussed case with admitting team patient has been accepted to their service for further evaluation treatment.  Case discussed with Dr. Dalene Seltzer who agrees with work-up and admission at this time.  Full personal protective equipment was worn during my encounter with this patient.  Note: Portions of this report may have been transcribed using voice recognition software. Every effort was made to ensure accuracy; however, inadvertent computerized transcription errors may still be present. Final Clinical Impressions(s) / ED Diagnoses   Final diagnoses:  Community acquired bilateral lower lobe pneumonia [facility])    ED Discharge Orders    None       Elizabeth Palau 06/04/18 2104    Alvira Monday, MD 06/05/18 [phone]

## 2018-06-05 ENCOUNTER — Other Ambulatory Visit: Payer: Self-pay

## 2018-06-05 ENCOUNTER — Inpatient Hospital Stay (HOSPITAL_COMMUNITY): Payer: 59

## 2018-06-05 DIAGNOSIS — I340 Nonrheumatic mitral (valve) insufficiency: Secondary | ICD-10-CM

## 2018-06-05 LAB — ECHOCARDIOGRAM COMPLETE
HEIGHTINCHES: 72 in
Weight: 3523.83 oz

## 2018-06-05 LAB — BASIC METABOLIC PANEL
Anion gap: 8 (ref 5–15)
BUN: 14 mg/dL (ref 6–20)
CALCIUM: 8.1 mg/dL — AB (ref 8.9–10.3)
CO2: 25 mmol/L (ref 22–32)
Chloride: 102 mmol/L (ref 98–111)
Creatinine, Ser: 1.36 mg/dL — ABNORMAL HIGH (ref 0.61–1.24)
GFR calc Af Amer: >60 mL/min (ref >60)
GFR calc non Af Amer: 60 mL/min — ABNORMAL LOW (ref >60)
Glucose, Bld: 103 mg/dL — ABNORMAL HIGH (ref 70–99)
Potassium: 3.8 mmol/L (ref 3.5–5.1)
Sodium: 135 mmol/L (ref 135–145)

## 2018-06-05 LAB — HIV ANTIBODY (ROUTINE TESTING W REFLEX): HIV Screen 4th Generation wRfx: NONREACTIVE

## 2018-06-05 LAB — CBC
HCT: 35.3 % — ABNORMAL LOW (ref 39.0–52.0)
Hemoglobin: 11.3 g/dL — ABNORMAL LOW (ref 13.0–17.0)
MCH: 28.3 pg (ref 26.0–34.0)
MCHC: 32 g/dL (ref 30.0–36.0)
MCV: 88.5 fL (ref 80.0–100.0)
Platelets: 302 10*3/uL (ref 150–400)
RBC: 3.99 MIL/uL — ABNORMAL LOW (ref 4.22–5.81)
RDW: 12.3 % (ref 11.5–15.5)
WBC: 10.4 10*3/uL (ref 4.0–10.5)
nRBC: 0 % (ref 0.0–0.2)

## 2018-06-05 LAB — STREP PNEUMONIAE URINARY ANTIGEN: Strep Pneumo Urinary Antigen: NEGATIVE

## 2018-06-05 LAB — TROPONIN I: Troponin I: <0.03 ng/mL (ref <0.03)

## 2018-06-05 LAB — MRSA PCR SCREENING: MRSA by PCR: NEGATIVE

## 2018-06-05 MED ORDER — HYDROCOD POLST-CPM POLST ER 10-8 MG/5ML PO SUER
5.0000 mL | Freq: Two times a day (BID) | ORAL | Status: DC | PRN
  Administered 2018-06-05 – 2018-06-06 (×3): 5 mL via ORAL
  Filled 2018-06-05 (×3): qty 5

## 2018-06-05 MED ORDER — SODIUM CHLORIDE 0.9 % IV SOLN
2.0000 g | Freq: Three times a day (TID) | INTRAVENOUS | Status: DC
  Administered 2018-06-05 – 2018-06-07 (×6): 2 g via INTRAVENOUS
  Filled 2018-06-05 (×7): qty 2

## 2018-06-05 MED ORDER — BUTALBITAL-APAP-CAFFEINE 50-325-40 MG PO TABS
1.0000 | ORAL_TABLET | Freq: Once | ORAL | Status: AC
  Administered 2018-06-05: 1 via ORAL
  Filled 2018-06-05: qty 1

## 2018-06-05 MED ORDER — ACETAMINOPHEN 500 MG PO TABS
500.0000 mg | ORAL_TABLET | Freq: Four times a day (QID) | ORAL | Status: DC | PRN
  Administered 2018-06-06 – 2018-06-07 (×2): 500 mg via ORAL
  Filled 2018-06-05 (×2): qty 1

## 2018-06-05 MED ORDER — GUAIFENESIN-CODEINE 100-10 MG/5ML PO SOLN
5.0000 mL | ORAL | Status: DC | PRN
  Filled 2018-06-05: qty 5

## 2018-06-05 MED ORDER — VANCOMYCIN HCL 10 G IV SOLR
2500.0000 mg | Freq: Once | INTRAVENOUS | Status: AC
  Administered 2018-06-05: 2500 mg via INTRAVENOUS
  Filled 2018-06-05: qty 2500

## 2018-06-05 MED ORDER — VANCOMYCIN HCL IN DEXTROSE 750-5 MG/150ML-% IV SOLN
750.0000 mg | Freq: Three times a day (TID) | INTRAVENOUS | Status: DC
  Administered 2018-06-05 – 2018-06-06 (×2): 750 mg via INTRAVENOUS
  Filled 2018-06-05 (×3): qty 150

## 2018-06-05 NOTE — Progress Notes (Addendum)
Pharmacy Antibiotic Note  Jack Lawson is a 53 y.o. male admitted on 06/04/2018 with pneumonia.  Pharmacy has been consulted for Vancomycin / Cefepime dosing. Patient has presented with cough and SOB. Patient was on zithromax and ceftriaxone now being escalated to cefepime / vancomycin. Patient's Scr stable at 1.36 past two days. Patient's WBC slightly down from 11.9 to 10.4 today. Patient is afebrile today.   Plan: - Discontinue Azithromycin and Ceftriaxone - Start Vancomycin 2500mg  x1 for loading dose then Vancomycin 750 mg IV Q 8 hrs.  Goal AUC 400-550. Expected AUC: 497 SCr used: 1.36 - Start Cefepime 2g every 8 hours - follow-up signs/symptoms of infection, C/S, LOT, renal function -f/u MRSA PCR for de-escalation of Vancomyicn  Height: 6' (182.9 cm) Weight: 220 lb 3.8 oz (99.9 kg) IBW/kg (Calculated) : 77.6  Temp (24hrs), Avg:98.8 F (37.1 C), Min:97.5 F (36.4 C), Max:99.9 F (37.7 C)  Recent Labs  Lab 06/04/18 1837 06/05/18 0240  WBC 11.9* 10.4  CREATININE 1.35* 1.36*    Estimated Creatinine Clearance: 78.6 mL/min (A) (by C-G formula based on SCr of 1.36 mg/dL (H)).    Allergies  Allergen Reactions  . Codeine Nausea And Vomiting    Antimicrobials this admission: 3/19 Ceftriaxone >> 3/20 3/19 Azithromycin >> 3/20 3/20 Vancomycin >> 3/20 Cefepime >>  Dose adjustments this admission: N/a  Microbiology results: 3/15 COVID Test Negative from OSH. 3/20 MRSA PCR    Thank you for allowing pharmacy to be a part of this patient's care.  Bradley Ferris, PharmD 06/05/2018 10:51 AM PGY-1 Pharmacy Resident Direct Phone: [phone] Please check [email or web address] for unit-specific pharmacist phone numbers

## 2018-06-05 NOTE — Progress Notes (Signed)
Family Medicine Teaching Service Daily Progress Note Intern Pager: [phone]  Patient name: Jack Lawson Medical record number: [id number] Date of birth: 08-07-1965 Age: 53 y.o. Gender: male  Primary Care Provider: [name], MD Consultants: none Code Status: Full  Pt Overview and Major Events to Date:  3/19-admission for pneumonia  Assessment and Plan: Jack Lawson is a 53 y.o. male presenting with shortness of breath and cough. PMH is significant for migraines, sinus tachycardia, chronic cough, depression, and fibromyalgia.  Community-acquired pneumonia Currently unclear if this is unresolved pneumonia or possibly a new incident of pneumonia following a flu infection.  Afebrile overnight, tachypnea up to 25, tachycardic to 105.  White blood cell count 10.4 down from 11.9 on admission.  Strep pneumoniae urinary antigen negative. -S/p azithromycin x1 -S/p ceftriaxone x1 -Consider clindamycin/vancomycin for MRSA coverage and pneumonia following influenza infection  Heart failure with unknown ejection fraction No known history of heart failure.  2.1 L urine output after IV Lasix 20 mg x 1.  Appears mildly fluid overloaded on exam. This morning he continues to endorse significant chest congestion with mild shortness of breath.  He reports that he feels that his edema is decreased.  Difficult to assess JVD, 1+ LE edema bilaterally. -Consider IV Lasix 20 mg twice daily -Monitor I's and O's -Daily weights -Follow-up echo  Possible CKD Unclear baseline creatinine.  Creatinine on admission 1.3.  Creatinine stable at 1.3. -Continue to monitor  Hypertension No new blood pressures since H&P.  No home medications -Monitor  Sinus tachycardia, chronic -Continue verapamil 180 mg daily -Follow-up echocardiogram  Chronic cough -Tessalon Perles -Loratadine daily -Hycodan cough syrup  Depression -We will [place] 150 mg daily  Migraines -Tylenol PRN  Insomnia -Continue  melatonin -Continue trazodone 100 mg nightly  GERD -Protonix 40 mg daily  FEN/GI: Heart healthy PPx: lovenox  Disposition: Possible DC 3/21  Subjective:  Feels mildly improved compared to admission.  He continues to notice chest congestion.  He notes that his swelling has decreased compared to admission.  Objective: Temp:  [98.8 F (37.1 C)-99.9 F (37.7 C)] 98.8 F (37.1 C) (03/19 2326) Pulse Rate:  [96-105] 96 (03/19 2326) Resp:  [16-25] 20 (03/19 2326) BP: (125-154)/(80-114) 142/82 (03/19 2326) SpO2:  [97 %-100 %] 100 % (03/19 2326) Weight:  [99.9 kg-108.9 kg] 99.9 kg (03/19 2151)  General: Alert and cooperative and appears to be in no acute distress HEENT: Neck non-tender without lymphadenopathy, masses or thyromegaly Cardio: Normal S1 and S2, no S3 or S4. Rhythm is regular. No murmurs or rubs.   Pulm: Clear to auscultation bilaterally, no crackles, wheezing, or diminished breath sounds. Normal respiratory effort Abdomen: Bowel sounds normal. Abdomen soft and non-tender.  Extremities: 2+ lower extremity edema bilaterally.  Radial pulse. Neuro: Cranial nerves grossly intact  Laboratory: Recent Labs  Lab 06/04/18 1837 06/05/18 0240  WBC 11.9* 10.4  HGB 12.9* 11.3*  HCT 39.8 35.3*  PLT 287 302   Recent Labs  Lab 06/04/18 1837 06/05/18 0240  NA 135 135  K 4.2 3.8  CL 100 102  CO2 25 25  BUN 14 14  CREATININE 1.35* 1.36*  CALCIUM 8.6* 8.1*  GLUCOSE 73 103*    Imaging/Diagnostic Tests: Dg Chest 2 View  Result Date: 05/16/2018 CLINICAL DATA:  Chronic cough. EXAM: CHEST - 2 VIEW COMPARISON:  02/20/2017 FINDINGS: Lungs are clear. Heart size and mediastinal contours are within normal limits. No effusion. Visualized bones unremarkable. IMPRESSION: No acute cardiopulmonary disease. Electronically Signed   By:  Corlis Leak M.D.   On: 05/16/2018 13:01   Dg Chest Portable 1 View  Result Date: 06/04/2018 CLINICAL DATA:  Question pneumonia, prior abnormal radiograph  EXAM: PORTABLE CHEST 1 VIEW COMPARISON:  05/31/2018 FINDINGS: Patchy interstitial and alveolar infiltrate at the left base. Stable cardiomegaly central vascular congestion. No pneumothorax. IMPRESSION: 1. Cardiomegaly with vascular congestion. 2. Patchy interstitial and alveolar infiltrate at the left base Electronically Signed   By: Jasmine Pang M.D.   On: 06/04/2018 18:17     Mirian Mo, MD 06/05/2018, 5:51 AM PGY-1, [facility] Family Medicine FPTS Intern pager: [phone], text pages welcome

## 2018-06-05 NOTE — Progress Notes (Signed)
Daily Nursing Note  Introduced self to patient Rieth in AM. He endorsed that he was feeling very poorly for the last two weeks and that he received exceptionally poor care at [facility] from March 14-15th. I called [facility] hospital who confirmed he was influenza A (+) on March 14th. I was able to get in touch with a representative of [facility] at [phone] who confirmed (-) Covid -19 results. Patient remains to be treated for PNA, ABX were transitioned from CTX/AZITHRO to Cefepime/Vancomycin this afternoon. Patient has since received his first dose of ABX w/o complication. He had a R arm IV infiltrate which required a new PIV to be placed. Given the complexity of the IV start the IV team was called to provide an US guided IV. Patient complaints of HA in L eye in afternoon for which we has given a x1 dose of fioricet with some relief. Patient extremely anxious regarding TTE results, sent Dr. Pilar Plate a page to endorse results to patient when able though I did inform the patient that the hospitalist team is quite busy therefore if he didn't hear back then results would be discussed in the AM. All patient needs met during shift.

## 2018-06-05 NOTE — Progress Notes (Signed)
  Echocardiogram 2D Echocardiogram has been performed.  Jack Lawson 06/05/2018, 2:17 PM

## 2018-06-06 ENCOUNTER — Encounter (HOSPITAL_COMMUNITY): Payer: Self-pay | Admitting: Radiology

## 2018-06-06 ENCOUNTER — Inpatient Hospital Stay (HOSPITAL_COMMUNITY): Payer: 59

## 2018-06-06 LAB — CBC
HCT: 36.3 % — ABNORMAL LOW (ref 39.0–52.0)
Hemoglobin: 11.6 g/dL — ABNORMAL LOW (ref 13.0–17.0)
MCH: 28.3 pg (ref 26.0–34.0)
MCHC: 32 g/dL (ref 30.0–36.0)
MCV: 88.5 fL (ref 80.0–100.0)
PLATELETS: 357 10*3/uL (ref 150–400)
RBC: 4.1 MIL/uL — ABNORMAL LOW (ref 4.22–5.81)
RDW: 12.3 % (ref 11.5–15.5)
WBC: 10.3 10*3/uL (ref 4.0–10.5)
nRBC: 0 % (ref 0.0–0.2)

## 2018-06-06 LAB — BASIC METABOLIC PANEL
Anion gap: 9 (ref 5–15)
BUN: 11 mg/dL (ref 6–20)
CO2: 23 mmol/L (ref 22–32)
Calcium: 8.4 mg/dL — ABNORMAL LOW (ref 8.9–10.3)
Chloride: 104 mmol/L (ref 98–111)
Creatinine, Ser: 1.33 mg/dL — ABNORMAL HIGH (ref 0.61–1.24)
GFR calc Af Amer: >60 mL/min (ref >60)
GFR calc non Af Amer: >60 mL/min (ref >60)
Glucose, Bld: 100 mg/dL — ABNORMAL HIGH (ref 70–99)
Potassium: 4.3 mmol/L (ref 3.5–5.1)
Sodium: 136 mmol/L (ref 135–145)

## 2018-06-06 MED ORDER — AZITHROMYCIN 250 MG PO TABS
500.0000 mg | ORAL_TABLET | Freq: Every day | ORAL | Status: DC
  Administered 2018-06-06 – 2018-06-07 (×2): 500 mg via ORAL
  Filled 2018-06-06 (×2): qty 2

## 2018-06-06 MED ORDER — IOHEXOL 300 MG/ML  SOLN
75.0000 mL | Freq: Once | INTRAMUSCULAR | Status: AC | PRN
  Administered 2018-06-06: 75 mL via INTRAVENOUS

## 2018-06-06 MED ORDER — MELOXICAM 7.5 MG PO TABS
7.5000 mg | ORAL_TABLET | Freq: Two times a day (BID) | ORAL | Status: DC
  Administered 2018-06-06 – 2018-06-07 (×3): 7.5 mg via ORAL
  Filled 2018-06-06 (×3): qty 1

## 2018-06-06 NOTE — Progress Notes (Signed)
MRSA PCR neg. Ok to [facility] per Dr. Constance Goltz.  Ulyses Southward, PharmD, BCIDP, AAHIVP, CPP Infectious Disease Pharmacist 06/06/2018 11:25 AM

## 2018-06-06 NOTE — Progress Notes (Addendum)
Family Medicine Teaching Service Daily Progress Note Intern Pager: [phone]  Patient name: Jack Lawson Medical record number: [id number] Date of birth: 24-Jan-1966 Age: 53 y.o. Gender: male  Primary Care Provider: [name], MD Consultants: none Code Status: Full  Pt Overview and Major Events to Date:  3/19-admission for pneumonia  Assessment and Plan: Jack Lawson is a 53 y.o. male presenting with shortness of breath and cough. PMH is significant for migraines, sinus tachycardia, chronic cough, depression, and fibromyalgia.  Community-acquired pneumonia Currently unclear if this is unresolved pneumonia or possibly a new incident of pneumonia following a flu infection.  Afebrile overnight, no tachycardia or tachypnea (improved from prior).  WBC 10.3 on 3/21. Strep pneumoniae urinary antigen negative. -S/p azithromycin x1, ceftriaxone x1 -Continue cefepime 2 g every 8 hours and vancomycin 750 mg every 8 hours, added PO Azithro 500mg  daily x 3 days. -follow up Chest CT -Consider adding low-dose prednisone for cough.  Heart failure with unknown ejection fraction No history of heart failure, patient had 2.1 L urine output after 1 dose IV Lasix 20 mg.  Pitting edema to lower extremities improved from 2+ to 1+. Patient's major complaint this morning is cough, no shortness of breath or chest congestion.  Patient diuresed overnight.  Echo 3/20 with LV EF 55% without diastolic dysfunction, IVC dilated. -Monitor I's and O's -Daily weights -Consider additional diuresis.  Possible CKD Unclear baseline creatinine.  Creatinine on admission 1.3.  Creatinine stable at 1.3. -Continue to monitor  Hypertension, improved: Blood pressure 119/67.  No home medications -Monitor  Sinus tachycardia, chronic, improved: Heart rate 69-82 overnight. -Continue verapamil 180 mg daily  Chronic cough -Tessalon Perles -Loratadine daily -Hycodan cough syrup  Depression -Wellbutryn 150 mg  daily  Migraines -Tylenol PRN  Insomnia -Continue melatonin -Continue trazodone 100 mg nightly  GERD -Protonix 40 mg daily  FEN/GI: Heart healthy PPx: lovenox  Disposition: Possible DC 3/22  Subjective:  Patient states he does not feel better this morning, although his vitals have improved and the patient is able to sleep lying flat.  Swelling has improved to his bilateral lower extremities and the patient continues to have nagging cough.  Objective: Temp:  [98.3 F (36.8 C)-99.1 F (37.3 C)] 98.4 F (36.9 C) (03/21 0807) Pulse Rate:  [74-82] 74 (03/21 0807) Resp:  [16-20] 16 (03/21 0807) BP: (106-121)/(67-77) 119/67 (03/21 0807) SpO2:  [90 %-96 %] 90 % (03/21 0807) Weight:  [825 kg] 108 kg (03/21 0447)  Physical Exam: General: No apparent distress Cardiovascular: Regular rate rhythm, heart rate 80s, S1-S2 auscultated, no murmurs, rubs, gallops Respiratory: Patient moving air well, no wheezes or crackles, no respiratory distress  Laboratory: Recent Labs  Lab 06/04/18 1837 06/05/18 0240 06/06/18 0701  WBC 11.9* 10.4 10.3  HGB 12.9* 11.3* 11.6*  HCT 39.8 35.3* 36.3*  PLT 287 302 357   Recent Labs  Lab 06/04/18 1837 06/05/18 0240 06/06/18 0701  NA 135 135 136  K 4.2 3.8 4.3  CL 100 102 104  CO2 25 25 23   BUN 14 14 11   CREATININE 1.35* 1.36* 1.33*  CALCIUM 8.6* 8.1* 8.4*  GLUCOSE 73 103* 100*    Imaging/Diagnostic Tests: Dg Chest 2 View  Result Date: 05/16/2018 CLINICAL DATA:  Chronic cough. EXAM: CHEST - 2 VIEW COMPARISON:  02/20/2017 FINDINGS: Lungs are clear. Heart size and mediastinal contours are within normal limits. No effusion. Visualized bones unremarkable. IMPRESSION: No acute cardiopulmonary disease. Electronically Signed   By: Ronald Pippins.D.  On: 05/16/2018 13:01   Dg Chest Portable 1 View  Result Date: 06/04/2018 CLINICAL DATA:  Question pneumonia, prior abnormal radiograph EXAM: PORTABLE CHEST 1 VIEW COMPARISON:  05/31/2018 FINDINGS:  Patchy interstitial and alveolar infiltrate at the left base. Stable cardiomegaly central vascular congestion. No pneumothorax. IMPRESSION: 1. Cardiomegaly with vascular congestion. 2. Patchy interstitial and alveolar infiltrate at the left base Electronically Signed   By: Jasmine Pang M.D.   On: 06/04/2018 18:17   Dollene Cleveland, DO 06/06/2018, 8:49 AM PGY-1, [place] [facility] FPTS Intern pager: [phone], text pages welcome

## 2018-06-06 NOTE — Discharge Summary (Signed)
Family Medicine Teaching [facility] Discharge Summary  Patient name: Jack Lawson Medical record number: [id number] Date of birth: Sep 06, 1965 Age: 53 y.o. Gender: male Date of Admission: 06/04/2018  Date of Discharge: 06/07/2018 Admitting Physician: Moses Manners, MD  Primary Care Provider: [name], MD Consultants: None  Indication for Hospitalization: Community-acquired pneumonia with failed outpatient treatment  Discharge Diagnoses/Problem List:  Community-acquired pneumonia Heart failure ruled out CKD Hypertension Sinus tachycardia Chronic cough Depression Migraines Insomnia GERD  Disposition: Discharge home  Discharge Condition: Stable  Discharge Exam:  General: Alert and cooperative and appears to be in no acute distress HEENT: Neck non-tender without lymphadenopathy, masses or thyromegaly Cardio: Normal S1 and S2, no S3 or S4. Rhythm is regular. No murmurs or rubs.   Pulm: Clear to auscultation bilaterally, no crackles, wheezing, or diminished breath sounds. Normal respiratory effort Abdomen: Bowel sounds normal. Abdomen soft and non-tender.  Extremities: No peripheral edema. Warm/ well perfused.  Strong radial pulse. Neuro: Cranial nerves grossly intact  Brief Hospital Course:   Community-acquired pneumonia He presented to the ED with cough and shortness of breath for 10 days and recent flu positive diagnosis.  He was admitted and initially treated with ceftriaxone ceftriaxone (3/19) and azithromycin (3/19) which were quickly broadened to vancomycin (3/20-3/21) and cefepime (3/20-3/22).  Over the next 2 days, he should significant improvement in his respiratory status and a decrease in his subjective "chest congestion".  He was discharged home with a 5-day course of azithromycin (3/21-3/25).  His last day of treatment is 3/25.  Congestive heart failure ruled out There is initial concern for fluid overload and heart failure due to his presentation with  shortness of breath and crackles with lower extremity edema.  IV Lasix 20 mg was given day of admission significant diuresis.  Echocardiogram on 3/20 showed no evidence of systolic, diastolic left-sided or right-sided heart failure and demonstrated a compressible IVC.  His lower extremity edema showed improvement before discharge.  There is no evidence to strongly support that he suffers from congestive heart failure at this time.  No medications for heart failure were started while in hospital.  Issues for Follow Up:  1. Will need a follow up chest CT in 4 weeks to follow up mediastinal adenopathy seen on chest CT this admission (likely reactive). 2. Follow-up with pulmonology regarding chronic cough.  Significant Procedures: None  Significant Labs and Imaging:  Recent Labs  Lab 06/05/18 0240 06/06/18 0701 06/07/18 0323  WBC 10.4 10.3 9.2  HGB 11.3* 11.6* 11.5*  HCT 35.3* 36.3* 36.3*  PLT 302 357 348   Recent Labs  Lab 06/04/18 1837 06/05/18 0240 06/06/18 0701 06/07/18 0323  NA 135 135 136 137  K 4.2 3.8 4.3 4.4  CL 100 102 104 104  CO2 25 25 23 26   GLUCOSE 73 103* 100* 109*  BUN 14 14 11 12   CREATININE 1.35* 1.36* 1.33* 1.35*  CALCIUM 8.6* 8.1* 8.4* 8.4*    Dg Chest 2 View  Result Date: 05/16/2018 CLINICAL DATA:  Chronic cough. EXAM: CHEST - 2 VIEW COMPARISON:  02/20/2017 FINDINGS: Lungs are clear. Heart size and mediastinal contours are within normal limits. No effusion. Visualized bones unremarkable. IMPRESSION: No acute cardiopulmonary disease. Electronically Signed   By: Corlis Leak M.D.   On: 05/16/2018 13:01   Ct Chest W Contrast  Result Date: 06/06/2018 CLINICAL DATA:  Follow-up pneumonia. EXAM: CT CHEST WITH CONTRAST TECHNIQUE: Multidetector CT imaging of the chest was performed during intravenous contrast administration. CONTRAST:  75mL OMNIPAQUE IOHEXOL 300 MG/ML  SOLN COMPARISON:  Abdominal CT 12/14/2008 and chest x-ray 05/15/2018 and 06/04/2018 FINDINGS:  Cardiovascular: Heart is normal size. Mild ectasia of the ascending thoracic aorta measuring 3.5 cm in AP diameter. Remaining vascular structures are unremarkable. Mediastinum/Nodes: 1.2 cm subcarinal lymph node. 1.2 cm right paratracheal lymph node and 1 cm precarinal lymph node. These are likely reactive. Remaining mediastinal structures are unremarkable. Lungs/Pleura: Lungs are adequately inflated and demonstrate mild bilateral patchy nodular airspace process likely infectious or inflammatory process. No significant effusions. Airways are normal. Upper Abdomen: No acute findings. Musculoskeletal: Mild degenerative change of the spine. IMPRESSION: Mild bilateral patchy nodular airspace process likely representing atypical infectious or inflammatory process. Minimal mediastinal adenopathy likely reactive. Recommend follow-up CT 4 weeks. 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 Recommend annual imaging followup by CTA or MRA. This recommendation follows 2010 ACCF/AHA/AATS/ACR/ASA/SCA/SCAI/SIR/STS/SVM Guidelines for the Diagnosis and Management of Patients with Thoracic Aortic Disease. Circulation.2010; 121: Z610-R604. Aortic aneurysm NOS (ICD10-I71.9). Electronically Signed   By: Elberta Fortis M.D.   On: 06/06/2018 09:53   Dg Chest Portable 1 View  Result Date: 06/04/2018 CLINICAL DATA:  Question pneumonia, prior abnormal radiograph EXAM: PORTABLE CHEST 1 VIEW COMPARISON:  05/31/2018 FINDINGS: Patchy interstitial and alveolar infiltrate at the left base. Stable cardiomegaly central vascular congestion. No pneumothorax. IMPRESSION: 1. Cardiomegaly with vascular congestion. 2. Patchy interstitial and alveolar infiltrate at the left base Electronically Signed   By: Jasmine Pang M.D.   On: 06/04/2018 18:17     Results/Tests Pending at Time of Discharge: none  Discharge Medications:  Allergies as of 06/07/2018      Reactions   Codeine Nausea And Vomiting      Medication List     TAKE these medications   azithromycin 250 MG tablet Commonly known as:  ZITHROMAX Take 1 tablet (250 mg total) by mouth daily for 3 days.   buPROPion 150 MG 24 hr tablet Commonly known as:  WELLBUTRIN XL Take 150 mg by mouth daily.   gabapentin 300 MG capsule Commonly known as:  NEURONTIN Take 300 mg by mouth daily.   HYDROcodone-homatropine 5-1.5 MG/5ML syrup Commonly known as:  HYCODAN Take 5 mLs by mouth every 6 (six) hours as needed for cough. What changed:  Another medication with the same name was added. Make sure you understand how and when to take each.   HYDROcodone-homatropine 5-1.5 MG/5ML syrup Commonly known as:  HYCODAN Take 5 mLs by mouth every 6 (six) hours as needed for cough. What changed:  You were already taking a medication with the same name, and this prescription was added. Make sure you understand how and when to take each.   Loratadine 10 MG Caps Take 10 mg by mouth daily.   Melatonin 5 MG Tabs Take 5 mg by mouth at bedtime.   meloxicam 7.5 MG tablet Commonly known as:  MOBIC Take 7.5 mg by mouth 2 (two) times daily.   methocarbamol 500 MG tablet Commonly known as:  ROBAXIN Take 500 mg by mouth daily as needed for muscle spasms.   multivitamin tablet Take 1 tablet by mouth daily.   omeprazole 40 MG capsule Commonly known as:  PRILOSEC Take 40 mg by mouth daily.   ondansetron 4 MG tablet Commonly known as:  Zofran Take 1 tablet (4 mg total) by mouth every 8 (eight) hours as needed for nausea or vomiting.   REGLAN PO Take 1 tablet by mouth daily as needed (nausea/vomiting).  Spiriva Respimat 1.25 MCG/ACT Aers Generic drug:  Tiotropium Bromide Monohydrate Inhale 2 puffs into the lungs daily.   verapamil 180 MG CR tablet Commonly known as:  CALAN-SR Take 180 mg by mouth daily.       Discharge Instructions: Please refer to Patient Instructions section of EMR for full details.  Patient was counseled important signs and symptoms that  should prompt return to medical care, changes in medications, dietary instructions, activity restrictions, and follow up appointments.   Follow-Up Appointments: He was told to follow-up with an outpatient provider only if he experienced worsened symptoms.  This advice was given in an effort to decrease the spread of coronavirus by decreasing unnecessary clinic visits.  Mirian Mo, MD 06/07/2018, 9:23 PM PGY-1, [facility]

## 2018-06-07 LAB — CBC
HCT: 36.3 % — ABNORMAL LOW (ref 39.0–52.0)
Hemoglobin: 11.5 g/dL — ABNORMAL LOW (ref 13.0–17.0)
MCH: 28.2 pg (ref 26.0–34.0)
MCHC: 31.7 g/dL (ref 30.0–36.0)
MCV: 89 fL (ref 80.0–100.0)
NRBC: 0 % (ref 0.0–0.2)
Platelets: 348 10*3/uL (ref 150–400)
RBC: 4.08 MIL/uL — ABNORMAL LOW (ref 4.22–5.81)
RDW: 12.2 % (ref 11.5–15.5)
WBC: 9.2 10*3/uL (ref 4.0–10.5)

## 2018-06-07 LAB — BASIC METABOLIC PANEL
Anion gap: 7 (ref 5–15)
BUN: 12 mg/dL (ref 6–20)
CO2: 26 mmol/L (ref 22–32)
Calcium: 8.4 mg/dL — ABNORMAL LOW (ref 8.9–10.3)
Chloride: 104 mmol/L (ref 98–111)
Creatinine, Ser: 1.35 mg/dL — ABNORMAL HIGH (ref 0.61–1.24)
GFR calc Af Amer: >60 mL/min (ref >60)
GFR calc non Af Amer: >60 mL/min (ref >60)
GLUCOSE: 109 mg/dL — AB (ref 70–99)
Potassium: 4.4 mmol/L (ref 3.5–5.1)
Sodium: 137 mmol/L (ref 135–145)

## 2018-06-07 MED ORDER — AZITHROMYCIN 250 MG PO TABS: 250.0000 mg | ORAL_TABLET | Freq: Every day | ORAL | 0 refills | Status: AC

## 2018-06-07 MED ORDER — AZITHROMYCIN 250 MG PO TABS: 250.0000 mg | ORAL_TABLET | Freq: Every day | ORAL | 0 refills | Status: DC

## 2018-06-07 MED ORDER — HYDROCODONE-HOMATROPINE 5-1.5 MG/5ML PO SYRP: 5.0000 mL | ORAL_SOLUTION | Freq: Four times a day (QID) | ORAL | 0 refills | Status: DC | PRN

## 2018-06-07 NOTE — Discharge Instructions (Signed)
Your hospitalized due to pneumonia.  While you are here in the hospital, you were treated with IV antibiotics and showed good improvement.  You will be sent home with 3 more days of antibiotics.    If you continue to recover well, he does not need to follow-up with an outpatient provider.  If you experience new symptoms or feel that your respiratory status is worsening after leaving the hospital, it would be important to follow-up with an outpatient physician.

## 2018-06-10 ENCOUNTER — Telehealth: Payer: Self-pay | Admitting: Internal Medicine

## 2018-06-10 ENCOUNTER — Telehealth (INDEPENDENT_AMBULATORY_CARE_PROVIDER_SITE_OTHER): Payer: 59 | Admitting: Nurse Practitioner

## 2018-06-10 ENCOUNTER — Other Ambulatory Visit: Payer: Self-pay

## 2018-06-10 DIAGNOSIS — J189 Pneumonia, unspecified organism: Secondary | ICD-10-CM

## 2018-06-10 DIAGNOSIS — J181 Lobar pneumonia, unspecified organism: Secondary | ICD-10-CM

## 2018-06-10 NOTE — Assessment & Plan Note (Addendum)
Patient states since discharge his symptoms have worsened.  He states that his chest congestion and cough have worsened and lower extremity edema.  He went to urgent care yesterday and states that chest x-ray showed worsening pneumonia.  He was prescribed Levaquin and prednisone taper as well as Lasix 20 mg daily for peripheral edema.  He states that he is much improved today and is afebrile today. Will recommend continuing treatment by urgent care with close follow up with [name] this week.   Patient Instructions  Continue Levaquin Continue prednisone Continue lasix Keep legs elevated as much as possible Low sodium diet  Follow up with [name] in 2 days If symptoms worsen please go to the ED

## 2018-06-10 NOTE — Patient Instructions (Signed)
Continue Levaquin Continue prednisone Continue lasix Keep legs elevated as much as possible Low sodium diet  Follow up with [name] in 2 days If symptoms worsen please go to the ED

## 2018-06-10 NOTE — Telephone Encounter (Signed)
Primary Pulmonologist: Everardo All Last office visit and with whom: 05/15/2018 What do we see them for (pulmonary problems): Chronic Cough  Reason for call: Pt requests HFU appt   In the last month, have you been in contact with someone who was confirmed or suspected to have Conoravirus / COVID-19?  no  Traveled via hub from [place] to [place] 4 wks ago  Do you have any of the following symptoms developed in the last 30 days? Fever: Yes Cough: Yes Shortness of breath: Yes  When did your symptoms start?  3 wks ago  If the patient has a fever, what is the last reading?  (use n/a if patient denies fever)  100.1 . IF THE PATIENT STATES THEY DO NOT OWN A THERMOMETER, THEY MUST GO AND PURCHASE ONE When did the fever start?: off and on for several wks Have you taken any medication to suppress a fever (ie Ibuprofen, Aleve, Tylenol)?: No  Spoke with the pt  He states d/c'ed from the hospital after having PNA on Sunday 06/04/2018. He states that he was told by hospitalist that he needed f/u here in our office this wk b/c they think he has "some underlying issues". He does not want televisit. He states that he was tested for covid 19 at [place] before his hospitalization and he was negative. I called [employer] ([phone] or [phone]) and spoke with Arline Asp who confirmed that the covid test done on 05/31/2018 was indeed negative. Please advise if we can see the pt here for HFU. He is currently still taking zithromax but still coughing and had the temp of 100.1 last night.

## 2018-06-10 NOTE — Progress Notes (Signed)
Virtual Visit via Telephone Note  I connected with Jack Lawson on 06/10/18 at  2:30 PM EDT by telephone and verified that I am speaking with the correct person using two identifiers.   I discussed the limitations, risks, security and privacy concerns of performing an evaluation and management service by telephone and the availability of in person appointments. I also discussed with the patient that there may be a patient responsible charge related to this service. The patient expressed understanding and agreed to proceed.   History of Present Illness: 53 year old male never smoker with history of chronic cough who is followed by [name].  Patient will be switching to [name] due to scheduling conflicts.  Patient was scheduled for a tele-visit today for a hospital follow-up.  He was admitted to the hospital on 06/04/2018 with community-acquired pneumonia after being diagnosed with influenza.  He was treated with vancomycin, cefepime, ceftriaxone, and azithromycin at discharge.  He also had lower extremity edema and was initially treated with IV Lasix but echo on 3/20 showed no evidence of systolic or diastolic left-sided or right-sided heart failure.  The ED doctor noted that there was no evidence to strongly support a diagnosis of congestive heart failure at that time.    Patient states since discharge his symptoms have worsened.  He states that his chest congestion and cough have worsened and lower extremity edema has returned.  He went to urgent care yesterday (06/10/18) and states that chest x-ray showed worsening pneumonia.  He was prescribed Levaquin and prednisone taper as well as Lasix 20 mg daily for peripheral edema.  He states that he is much improved today and is afebrile today. Denies f/c/s, n/v/d, hemoptysis, PND, leg swelling.    Observations/Objective: CT chest 06/06/18 - Mild bilateral patchy nodular airspace process likely representing atypical infectious or inflammatory  process. Minimal mediastinal adenopathy likely reactive. Recommend follow-up CT 4 weeks. 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 Recommend annual imaging followup by CTA or MRA.  CXR 06/04/18 - Cardiomegaly with vascular congestion. Patchy interstitial and alveolar infiltrate at the left base  Assessment and Plan: Patient states since hospital discharge his symptoms have worsened.  He states that his chest congestion and cough have worsened and lower extremity edema.  He went to urgent care yesterday and states that chest x-ray showed worsening pneumonia.  He was prescribed Levaquin and prednisone taper as well as Lasix 20 mg daily for peripheral edema.  He states that he is much improved today and is afebrile today. Will recommend continuing treatment by urgent care with close follow up with [name] this week.   Patient Instructions  Continue Levaquin Continue prednisone Continue lasix Keep legs elevated as much as possible Low sodium diet   Follow Up Instructions:  Follow up with [name] in 2 days If symptoms worsen please go to the ED  I discussed the assessment and treatment plan with the patient. The patient was provided an opportunity to ask questions and all were answered. The patient agreed with the plan and demonstrated an understanding of the instructions.   The patient was advised to call back or seek an in-person evaluation if the symptoms worsen or if the condition fails to improve as anticipated.  I provided 23 minutes of non-face-to-face time during this encounter.   [name], NP

## 2018-06-10 NOTE — Progress Notes (Signed)
Chart and office note reviewed in detail  > agree with a/p as outlined    

## 2018-06-10 NOTE — Telephone Encounter (Signed)
Please tell patient that we can schedule a follow up tele-visit only. If he is feeling worse and has developed a new fever then he should go back to the ED.

## 2018-06-10 NOTE — Telephone Encounter (Signed)
Spoke with pt. He is aware of Tonya's recommendation. He was not very happy about this. Telehealth visit has been scheduled for today at 2:30pm with Tonya. Nothing further was needed.

## 2018-06-11 ENCOUNTER — Encounter: Payer: Self-pay | Admitting: General Surgery

## 2018-06-12 ENCOUNTER — Ambulatory Visit: Payer: 59 | Admitting: Internal Medicine

## 2018-06-12 ENCOUNTER — Encounter: Payer: Self-pay | Admitting: Internal Medicine

## 2018-06-12 ENCOUNTER — Encounter: Payer: Self-pay | Admitting: *Deleted

## 2018-06-12 ENCOUNTER — Other Ambulatory Visit: Payer: Self-pay

## 2018-06-12 ENCOUNTER — Ambulatory Visit (INDEPENDENT_AMBULATORY_CARE_PROVIDER_SITE_OTHER): Payer: 59

## 2018-06-12 VITALS — BP 118/70 | HR 71 | Temp 98.1°F | Ht 72.0 in | Wt 235.0 lb

## 2018-06-12 DIAGNOSIS — R058 Other specified cough: Secondary | ICD-10-CM

## 2018-06-12 DIAGNOSIS — J181 Lobar pneumonia, unspecified organism: Secondary | ICD-10-CM

## 2018-06-12 DIAGNOSIS — M7989 Other specified soft tissue disorders: Secondary | ICD-10-CM | POA: Diagnosis not present

## 2018-06-12 DIAGNOSIS — R05 Cough: Secondary | ICD-10-CM

## 2018-06-12 DIAGNOSIS — J189 Pneumonia, unspecified organism: Secondary | ICD-10-CM

## 2018-06-12 MED ORDER — FUROSEMIDE 20 MG PO TABS: 20.0000 mg | ORAL_TABLET | Freq: Every day | ORAL | 0 refills | Status: DC

## 2018-06-12 MED ORDER — OMEPRAZOLE 40 MG PO CPDR: DELAYED_RELEASE_CAPSULE | ORAL | 2 refills | Status: DC

## 2018-06-12 MED ORDER — HYDROCODONE-ACETAMINOPHEN 10-325 MG PO TABS: 1.0000 | ORAL_TABLET | ORAL | 0 refills | Status: DC | PRN

## 2018-06-12 MED ORDER — PREDNISONE 10 MG PO TABS: ORAL_TABLET | ORAL | 0 refills | Status: DC

## 2018-06-12 NOTE — Assessment & Plan Note (Signed)
Echo nl 06/05/2018 Developed p started gabapentin > 06/12/2018 rec trial off x one week and use furosemide 20 mg daily prn    Total time devoted to counseling  > 50 % of initial 60 min office visit:  review case with pt/ discussion of options/alternatives/ personally creating written customized instructions  in presence of pt  then going over those specific  Instructions directly with the pt including how to use all of the meds but in particular covering each new medication in detail and the difference between the maintenance= "automatic" meds and the prns using an action plan format for the latter (If this problem/symptom => do that organization reading Left to right).  Please see AVS from this visit for a full list of these instructions which I personally wrote for this pt and  are unique to this visit.

## 2018-06-12 NOTE — Progress Notes (Signed)
Left detailed msg on machine ok per DPR

## 2018-06-12 NOTE — Patient Instructions (Addendum)
Finish your levaquin,  Stop cough syrup, spiriva and zyrtec   Continue furosemide 20 mg daily   Leave off gabapentin 300 mg for a week to see what happens with the swelling first   The key to effective treatment for your cough is eliminating the non-stop cycle of cough you're stuck in long enough to let your airway heal completely and then see if there is anything still making you cough once you stop the cough suppression, but this should take no more than 5 days to figure out  First take delsym two tsp every 12 hours and supplement if needed with  hyrdocone 10 mg  every 4 hours to suppress the urge to cough at all or even clear your throat. Swallowing water or using ice chips/non mint and menthol containing candies (such as lifesavers or sugarless jolly ranchers) are also effective.  You should rest your voice and avoid activities that you know make you cough.  Once you have eliminated the cough for 3 straight days try reducing the hydrocodone first,  then the delsym as tolerated.    When you finish the Prednisone 50 start  10 mg take  4 each am x 2 days,   2 each am x 2 days,  1 each am x 2 days and stop (this is to eliminate allergies and inflammation from coughing)   Omeprazole 40 mg Take 30- 60 min before your first and last meals of the day    For drainage / throat tickle try take CHLORPHENIRAMINE  4 mg - take one every 4 hours as needed - available over the counter- may cause drowsiness so start with just a bedtime dose or two and see how you tolerate it before trying in daytime     GERD (REFLUX)  is an extremely common cause of respiratory symptoms, many times with no significant heartburn at all.    It can be treated with medication, but also with lifestyle changes including avoidance of late meals, excessive alcohol, smoking cessation, and avoid fatty foods, chocolate, peppermint, colas, red wine, and acidic juices such as orange juice.  NO MINT OR MENTHOL PRODUCTS SO NO COUGH DROPS    USE HARD CANDY INSTEAD (jolley ranchers or Stover's or Lifesavers (all available in sugarless versions) NO OIL BASED VITAMINS - use powdered substitutes.   Please remember to go to the  x-ray department  for your tests - we will call you with the results when they are available    Call if not all better in one week to schedule a televisit          .

## 2018-06-12 NOTE — Assessment & Plan Note (Addendum)
Pos Influenza/ neg Covid-19 testing 05/26/2018 - cxr improved 06/12/2018   rec complete the levaquin rx he already has and taper off prednisone x 10 days

## 2018-06-12 NOTE — Assessment & Plan Note (Signed)
Onset ? Childhood - Allergy eval by [facility] pos mold/grass > never resolved pnds - 05/15/2018 ACEi d/c by Mechele Collin, MD   Upper airway cough syndrome (previously labeled PNDS),  is so named because it's frequently impossible to sort out how much is  CR/sinusitis with freq throat clearing (which can be related to primary GERD)   vs  causing  secondary (" extra esophageal")  GERD from wide swings in gastric pressure that occur with throat clearing, often  promoting self use of mint and menthol lozenges that reduce the lower esophageal sphincter tone and exacerbate the problem further in a cyclical fashion.   These are the same pts (now being labeled as having "irritable larynx syndrome" by some cough centers) who not infrequently have a history of having failed to tolerate ace inhibitors,  dry powder inhalers or biphosphonates or report having atypical/extraesophageal reflux symptoms that don't respond to standard doses of PPI  and are easily confused as having aecopd or asthma flares by even experienced allergists/ pulmonologists (myself included).    Of the three most common causes of  Sub-acute / recurrent or chronic cough, only one (GERD)  can actually contribute to/ trigger  the other two (asthma and post nasal drip syndrome)  and perpetuate the cylce of cough.  While not intuitively obvious, many patients with chronic low grade reflux do not cough until there is a primary insult that disturbs the protective epithelial barrier and exposes sensitive nerve endings.   This is typically viral but can due to PNDS and  either may apply here.   The point is that once this occurs, it is difficult to eliminate the cycle  using anything but a maximally effective acid suppression regimen at least in the short run, accompanied by an appropriate diet to address non acid GERD and control / eliminate the cough itself for at least 3 days with hydrocodone up to 10 mg every 4 hours and eliminate pnds with 1st  gen H1 blockers per guidelines  Then regroup in 2 weeks by phone until/unless  corona restrictions have been lifted.   Advised:  The standardized cough guidelines published in Chest by [place] in 2006 are still the best available and consist of a multiple step process (up to 12!) , not a single office visit,  and are intended  to address this problem logically,  with an alogrithm dependent on response to empiric treatment at  each progressive step  to determine a specific diagnosis with  minimal addtional testing needed. Therefore if adherence is an issue or can't be accurately verified,  it's very unlikely the standard evaluation and treatment will be successful here.    Furthermore, response to therapy (other than acute cough suppression, which should only be used short term with avoidance of narcotic containing cough syrups if possible), can be a gradual process for which the patient is not likely to  perceive immediate benefit.  Unlike going to an eye doctor where the best perscription is almost always the first one and is immediately effective, this is almost never the case in the management of chronic cough syndromes. Therefore the patient needs to commit up front to consistently adhere to recommendations  for up to 6 weeks of therapy directed at the likely underlying problem(s) before the response can be reasonably evaluated.

## 2018-06-12 NOTE — Progress Notes (Signed)
Subjective:   PATIENT ID: Jack Lawson: male DOB: 1965-10-01  MRN: [id number]   New pt eval/ Jack Lawson  05/15/2018  Reason for Visit: New consult for chronic cough Jack Lawson is a 53 year old male never smoker with atrial arrhythmias, hypertension, migraine who presents for chronic cough. He has had chronic cough for over a year and has been treated with multiple rounds of antibiotics (at least 5) without improvement. He initially had symptoms of bronchitis. His cough is mainly dry and hacking in character and nonproductive and associated with sore throat. He feels an internal irritation that stimulates his cough. He noticed after laughing he would have severe coughing fits. In the last few months cough will occur without laughing. When lying down, cough will worsen. It interferes with his sleep. Has tried over the counter medications and tessalon perles. Triggered by cold air, activity, bending over, laying over. Symptoms only improve with time. Reports allergies Lawson year-round and taking claritin. Has post nasal drip. Reports reflux which is well-controlled on omeprazole daily. Denies chest tightness and wheezing.  Hx of DVT/PE in mom and aunts.  Reviewed Cardiology 02/17/17: Followed for palpitations and started on Flecainide. 48Hr Holter 06/10/17: Focal atrial tachycardia rec Chronic Cough -CXR today -STOP Lisinopril x 1 month -Follow-up with PCP in 2 weeks for blood pressure check -We will need to schedule Pulmonary Function Test. Please contact our office for the best date for your work schedule -Cough syrup as needed -Zofran as needed for nausea related to cough syrup Allergies -Take Flonase 1 spray per nare daily       Date of Admission: 06/04/2018                      Date of Discharge: 06/07/2018 Admitting Physician: Jack Manners, MD  Primary Care Provider: [name], MD Consultants: None  Indication for Hospitalization: Community-acquired  pneumonia with failed outpatient treatment  Discharge Diagnoses/Problem List:  Community-acquired pneumonia Heart failure ruled out CKD Hypertension Sinus tachycardia Chronic cough Depression Migraines Insomnia GERD    Brief Hospital Course:   Community-acquired pneumonia He presented to the ED with cough and shortness of breath for 10 days and recent flu positive diagnosis.  He was admitted and initially treated with ceftriaxone ceftriaxone (3/19) and azithromycin (3/19) which were quickly broadened to vancomycin (3/20-3/21) and cefepime (3/20-3/22).  Over the next 2 days, he should significant improvement in his respiratory status and a decrease in his subjective "chest congestion".  He was discharged home with a 5-day course of azithromycin (3/21-3/25).  His last day of treatment is 3/25.  Congestive heart failure ruled out There is initial concern for fluid overload and heart failure due to his presentation with shortness of breath and crackles with lower extremity edema.  IV Lasix 20 mg was given day of admission significant diuresis.  Echocardiogram on 3/20 showed no evidence of systolic, diastolic left-sided or right-sided heart failure and demonstrated a compressible IVC.  His lower extremity edema showed improvement before discharge.  There is no evidence to strongly support that he suffers from congestive heart failure at this time.  No medications for heart failure were started while in hospital. D/c meds TAKE these medications   azithromycin 250 MG tablet Commonly known as:  ZITHROMAX Take 1 tablet (250 mg total) by mouth daily for 3 days.   buPROPion 150 MG 24 hr tablet Commonly known as:  WELLBUTRIN XL Take 150 mg by mouth daily.   gabapentin  300 MG capsule Commonly known as:  NEURONTIN Take 300 mg by mouth daily.   HYDROcodone-homatropine 5-1.5 MG/5ML syrup Commonly known as:  HYCODAN Take 5 mLs by mouth every 6 (six) hours as needed for cough. What  changed:  Another medication with the same name was added. Make sure you understand how and when to take each.   HYDROcodone-homatropine 5-1.5 MG/5ML syrup Commonly known as:  HYCODAN Take 5 mLs by mouth every 6 (six) hours as needed for cough. What changed:  You were already taking a medication with the same name, and this prescription was added. Make sure you understand how and when to take each.   Loratadine 10 MG Caps Take 10 mg by mouth daily.   Melatonin 5 MG Tabs Take 5 mg by mouth at bedtime.   meloxicam 7.5 MG tablet Commonly known as:  MOBIC Take 7.5 mg by mouth 2 (two) times daily.   methocarbamol 500 MG tablet Commonly known as:  ROBAXIN Take 500 mg by mouth daily as needed for muscle spasms.   multivitamin tablet Take 1 tablet by mouth daily.   omeprazole 40 MG capsule Commonly known as:  PRILOSEC Take 40 mg by mouth daily.   ondansetron 4 MG tablet Commonly known as:  Zofran Take 1 tablet (4 mg total) by mouth every 8 (eight) hours as needed for nausea or vomiting.   REGLAN PO Take 1 tablet by mouth daily as needed (nausea/vomiting).   Spiriva Respimat 1.25 MCG/ACT Aers Generic drug:  Tiotropium Bromide Monohydrate Inhale 2 puffs into the lungs daily.   verapamil 180 MG CR tablet Commonly known as:  CALAN-SR Take 180 mg by mouth daily.       06/10/2018 Televisit/ [name]  Patient was scheduled for a tele-visit today for a hospital follow-up.  He was admitted to the hospital on 06/04/2018 with community-acquired pneumonia after being diagnosed with influenza.  He was treated with vancomycin, cefepime, ceftriaxone, and azithromycin at discharge.  He also had lower extremity edema and was initially treated with IV Lasix but echo on 3/20 showed no evidence of systolic or diastolic left-sided or right-sided heart failure.  The ED doctor noted that there was no evidence to strongly support a diagnosis of congestive heart failure at that time.     Patient states since discharge his symptoms have worsened.  He states that his chest congestion and cough have worsened and lower extremity edema has returned.  He went to urgent care yesterday (06/10/18) and states that chest x-ray showed worsening pneumonia.  He was prescribed Levaquin and prednisone taper as well as Lasix 20 mg daily for peripheral edema.  He states that he is much improved today and is afebrile today. Denies f/c/s, n/v/d, hemoptysis, PND, leg swelling.   Observations/Objective: CT chest 06/06/18 - Mild bilateral patchy nodular airspace process likely representing atypical infectious or inflammatory process. Minimal mediastinal adenopathy likely reactive. Recommend follow-up CT 4 weeks. 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 Recommend annual imaging followup by CTA or MRA.  CXR 06/04/18 - Cardiomegaly with vascular congestion. Patchy interstitial and alveolar infiltrate at the left base  Assessment and Plan: Patient states since hospital discharge his symptoms have worsened.  He states that his chest congestion and cough have worsened and lower extremity edema.  He went to urgent care yesterday and states that chest x-ray showed worsening pneumonia.  He was prescribed Levaquin and prednisone taper as well as Lasix 20 mg daily for peripheral edema.  He  states that he is much improved today and is afebrile today. Will recommend continuing treatment by urgent care with close follow up with Jack. Sherene Sires this week.   Patient Instructions  Continue Levaquin Continue prednisone Continue lasix Keep legs elevated as much as possible Low sodium diet   Follow Up Instructions:  Follow up with Jack. Sherene Sires in 2 days If symptoms worsen please go to the ED    06/12/2018  Extended  ov/Jenipher Havel re: consultation/ second opinion re chronic cough/ acutely worse since mid feb 2020 in setting of documented influenza with neg COVID-19 testing Chief Complaint  Patient  presents with   Consult  Never smoker grew up [place]  [place] up with sneezing/runny/nose/cough as far back as he remember eval by allergist in his 30's by [facility] with pos mold/grass improved  with zyrtec but still had nasal drainage mostly daytime changed to clariton when zyrtec quit working which helped / flonase made drainage while on acei x decades stopped last pill 05/15/18 at [facility] and no better on cough syrup then developed fever worse cough aches 05/26/2018 pos Influenza > Tamiflu    Started rx 05/28/18 > back [facility] 05/30/18 > overnight rx doxy/ zmax Levaquin > fever resolved / aches resolved but cough and breathing worse so admitted as above with dx of pna.  On way home from above admit started back with bad cough/ fever > UC 06/09/18 and started back on levaquin / prednisone.    Dyspnea:  Ok if not coughing Cough: non productive, still has sensation of pnds/ cough on insp/ better with "short acting tussionex" Sleeping: last able to sleep flat 3 weeks prior to OV  p clonapin and melatonin and ambien SABA use: no better on spiriva  02: none    No obvious day to day or daytime variability or assoc excess/ purulent sputum or mucus plugs or hemoptysis or cp or chest tightness, subjective wheeze or overt sinus or hb symptoms.    Also denies any obvious fluctuation of symptoms with weather or environmental changes or other aggravating or alleviating factors except as outlined above   No unusual exposure hx or h/o childhood pna  or knowledge of premature birth.  Current Allergies, Complete Past Medical History, Past Surgical History, Family History, and Social History were reviewed in [employer] record.  ROS  The following are not active complaints unless bolded Hoarseness, sore throat, dysphagia, dental problems, itching, sneezing,  nasal congestion or discharge of excess mucus or purulent secretions, ear ache,   fever, chills, sweats, unintended  wt loss or wt gain, classically pleuritic or exertional cp,  orthopnea pnd or arm/hand swelling  or leg swelling since started gabapentin /better with lasix , presyncope, palpitations, abdominal pain, anorexia, nausea, vomiting, diarrhea  or change in bowel habits or change in bladder habits, change in stools or change in urine, dysuria, hematuria,  rash, arthralgias, visual complaints, headache, numbness, weakness or ataxia or problems with walking or coordination,  change in mood or  memory.        Current Meds  Medication Sig   buPROPion (WELLBUTRIN XL) 150 MG 24 hr tablet Take 150 mg by mouth daily.    cetirizine (ZYRTEC) 10 MG tablet Take 10 mg by mouth daily.   chlorpheniramine-HYDROcodone (TUSSIONEX PENNKINETIC ER) 10-8 MG/5ML SUER Take 5 mLs by mouth every 12 (twelve) hours as needed for cough.   gabapentin (NEURONTIN) 300 MG capsule Take 300 mg by mouth daily.  HYDROcodone-homatropine (HYCODAN) 5-1.5 MG/5ML syrup Take 5 mLs by mouth every 6 (six) hours as needed for cough.   levofloxacin (LEVAQUIN) 750 MG tablet Take 750 mg by mouth daily.   Melatonin 5 MG TABS Take 5 mg by mouth at bedtime.    meloxicam (MOBIC) 7.5 MG tablet Take 7.5 mg by mouth 2 (two) times daily.    methocarbamol (ROBAXIN) 500 MG tablet Take 500 mg by mouth daily as needed for muscle spasms.   Metoclopramide HCl (REGLAN PO) Take 1 tablet by mouth daily as needed (nausea/vomiting).   montelukast (SINGULAIR) 10 MG tablet Take 10 mg by mouth at bedtime.   Multiple Vitamin (MULTIVITAMIN) tablet Take 1 tablet by mouth daily.   omeprazole (PRILOSEC) 40 MG capsule Take 40 mg by mouth daily.    ondansetron (ZOFRAN) 4 MG tablet Take 1 tablet (4 mg total) by mouth every 8 (eight) hours as needed for nausea or vomiting.   predniSONE (DELTASONE) 50 MG tablet Take 50 mg by mouth daily with breakfast.   SPIRIVA RESPIMAT 1.25 MCG/ACT AERS Inhale 2 puffs into the lungs daily.   verapamil (CALAN-SR) 180 MG CR  tablet Take 180 mg by mouth daily.                  Objective:    hoarse amb wm with severe coughing fits on insp   Wt Readings from Last 3 Encounters:  06/12/18 235 lb (106.6 kg)  06/06/18 238 lb 1.6 oz (108 kg)  05/15/18 243 lb 6.4 oz (110.4 kg)     Vital signs reviewed - Note on arrival 02 sats  97% on RA     HEENT: nl dentition, turbinates bilaterally, and oropharynx. Nl external ear canals without cough reflex   NECK :  without JVD/Nodes/TM/ nl carotid upstrokes bilaterally   LUNGS: no acc muscle use,  Nl contour chest which is clear to A and P bilaterally without cough on insp or exp maneuvers   CV:  RRR  no s3 or murmur or increase in P2, and  1+ pitting edema  both LE's  ABD:  soft and nontender with nl inspiratory excursion in the supine position. No bruits or organomegaly appreciated, bowel sounds nl  MS:  Nl gait/ ext warm without deformities, calf tenderness, cyanosis or clubbing No obvious joint restrictions   SKIN: warm and dry without lesions    NEURO:  alert, approp, nl sensorium with  no motor or cerebellar deficits apparent.     CXR PA and Lateral:   06/12/2018 :    I personally reviewed images and agree with radiology impression as follows:    Patchy bilateral nodular opacities are again seen as on the 2 most recent examinations. The chest is better expanded on today's study compared to the most recent plain film of the chest with decreased basilar atelectasis. Heart size is normal. No pneumothorax or pleural effusion. My impression: minimal RN changes since nl baseline cxr 05/15/2018     Assessment & Plan:

## 2018-06-15 ENCOUNTER — Telehealth: Payer: Self-pay | Admitting: Internal Medicine

## 2018-06-15 DIAGNOSIS — R05 Cough: Secondary | ICD-10-CM

## 2018-06-15 DIAGNOSIS — R053 Chronic cough: Secondary | ICD-10-CM

## 2018-06-15 NOTE — Telephone Encounter (Signed)
I spoke to pt: 1) Instructed to use bed blocks or sleep on recliner to reduce noct gerd plus add reglan 10 mg before supper and hs to see if helps "gurgling" sensation which I doubt is due to unadressed pulmonary problem   2) Advised to use extra dose of furosemide 20 mg now    Go ahead and refer to ENT for chronic cough with pnds

## 2018-06-15 NOTE — Telephone Encounter (Signed)
Order placed nothing further needed at this time °

## 2018-06-15 NOTE — Telephone Encounter (Signed)
Patient states that the gurgling in his lung is getting bad his o2 is 95%. Patient was seen on Friday but feels he has gotten worse as far as the gurgling. He says that this is causing to feel as if he is drowning.

## 2018-06-22 ENCOUNTER — Telehealth: Payer: Self-pay | Admitting: Internal Medicine

## 2018-06-22 MED ORDER — HYDROCODONE-ACETAMINOPHEN 10-325 MG PO TABS: 1.0000 | ORAL_TABLET | ORAL | 0 refills | Status: DC | PRN

## 2018-06-22 NOTE — Telephone Encounter (Signed)
LMTCB to relay MW recommendations.

## 2018-06-22 NOTE — Telephone Encounter (Signed)
Ok to renew x but be sure he understands to take as per instructions at ov with me with the idea we're trying to get ahead of the cough but dosing aggressively x 3 days then off

## 2018-06-22 NOTE — Telephone Encounter (Signed)
Pt returned call & can be reached at [phone].  Thank you!

## 2018-06-22 NOTE — Telephone Encounter (Signed)
Returned phone call to patient, patient is inquiring about his referral to ENT. Made aware an order has been placed however we are expecting delays in turn around time due to corona. Patient voiced understanding. Thanked patient for being understanding.   Call made to [facility], was on hold for approx 10 minutes and later transferred to be placed on hold again. Will attempt to call back at later time.   Patient is requesting a refill of his hydrocodone. He states it helped with his cough while he was on it but once he stopped it came back. He is requesting a week supply to help with his cough.   MW please advise on hydrocodone refill. Order has been pended.

## 2018-06-22 NOTE — Telephone Encounter (Signed)
Pt has been made aware of MW recommendations. Nothing further needed.

## 2018-07-06 ENCOUNTER — Ambulatory Visit (INDEPENDENT_AMBULATORY_CARE_PROVIDER_SITE_OTHER): Payer: 59 | Admitting: Internal Medicine

## 2018-07-06 ENCOUNTER — Telehealth: Payer: Self-pay | Admitting: Internal Medicine

## 2018-07-06 ENCOUNTER — Other Ambulatory Visit: Payer: Self-pay

## 2018-07-06 DIAGNOSIS — J189 Pneumonia, unspecified organism: Secondary | ICD-10-CM

## 2018-07-06 DIAGNOSIS — R058 Other specified cough: Secondary | ICD-10-CM

## 2018-07-06 DIAGNOSIS — R05 Cough: Secondary | ICD-10-CM | POA: Diagnosis not present

## 2018-07-06 DIAGNOSIS — J181 Lobar pneumonia, unspecified organism: Secondary | ICD-10-CM

## 2018-07-06 DIAGNOSIS — M7989 Other specified soft tissue disorders: Secondary | ICD-10-CM | POA: Diagnosis not present

## 2018-07-06 MED ORDER — HYDROCODONE-ACETAMINOPHEN 10-325 MG PO TABS: 1.0000 | ORAL_TABLET | ORAL | 0 refills | Status: DC | PRN

## 2018-07-06 NOTE — Telephone Encounter (Signed)
Instructions from pt's last OV with MW 06/12/2018: Doreatha Martin your levaquin,  Stop cough syrup, spiriva and zyrtec   Continue furosemide 20 mg daily   Leave off gabapentin 300 mg for a week to see what happens with the swelling first   The key to effective treatment for your cough is eliminating the non-stop cycle of cough you're stuck in long enough to let your airway heal completely and then see if there is anything still making you cough once you stop the cough suppression, but this should take no more than 5 days to figure out  First take delsym two tsp every 12 hours and supplement if needed with  hyrdocone 10 mg  every 4 hours to suppress the urge to cough at all or even clear your throat. Swallowing water or using ice chips/non mint and menthol containing candies (such as lifesavers or sugarless jolly ranchers) are also effective.  You should rest your voice and avoid activities that you know make you cough.  Once you have eliminated the cough for 3 straight days try reducing the hydrocodone first,  then the delsym as tolerated.    When you finish the Prednisone 50 start  10 mg take  4 each am x 2 days,   2 each am x 2 days,  1 each am x 2 days and stop (this is to eliminate allergies and inflammation from coughing)  Omeprazole 40 mg Take 30- 60 min before your first and last meals of the day   For drainage / throat tickle try take CHLORPHENIRAMINE  4 mg - take one every 4 hours as needed - available over the counter- may cause drowsiness so start with just a bedtime dose or two and see how you tolerate it before trying in daytime   GERD (REFLUX)  is an extremely common cause of respiratory symptoms, many times with no significant heartburn at all.    It can be treated with medication, but also with lifestyle changes including avoidance of late meals, excessive alcohol, smoking cessation, and avoid fatty foods, chocolate, peppermint, colas, red wine, and acidic juices such as orange  juice.  NO MINT OR MENTHOL PRODUCTS SO NO COUGH DROPS   USE HARD CANDY INSTEAD (jolley ranchers or Stover's or Lifesavers (all available in sugarless versions) NO OIL BASED VITAMINS - use powdered substitutes.  Please remember to go to the  x-ray department  for your tests - we will call you with the results when they are available   Call if not all better in one week to schedule a televisit     Called and spoke with pt in regards to his cough and asked if he is still taking meds as directed by MW. Pt stated he is still taking the omeprazole but has finished the hydrocodone, abx, and prednisone. Pt stated he does have the throat tickle again as well is still coughing after finishing meds that were prescribed. Stated to pt that we should schedule a televisit so MW could further address symptoms. Pt expressed understanding. televisit has been scheduled for pt with MW today at 4:30. Nothing further needed.

## 2018-07-06 NOTE — Progress Notes (Signed)
Subjective:   PATIENT ID: Jack PaxStephen P Lawson GENDER: male DOB: Aug 16, 1965  MRN: [id number]   New pt eval/ Dr Everardo AllEllison  05/15/2018  Reason for Visit: New consult for chronic cough Jack Lawson is a 53 year old male never smoker with atrial arrhythmias, hypertension, migraine who presents for chronic cough. He has had chronic cough for over a year and has been treated with multiple rounds of antibiotics (at least 5) without improvement. He initially had symptoms of bronchitis. His cough is mainly dry and hacking in character and nonproductive and associated with sore throat. He feels an internal irritation that stimulates his cough. He noticed after laughing he would have severe coughing fits. In the last few months cough will occur without laughing. When lying down, cough will worsen. It interferes with his sleep. Has tried over the counter medications and tessalon perles. Triggered by cold air, activity, bending over.   Symptoms only improve with time. Reports allergies all year-round and taking claritin. Has post nasal drip. Reports reflux which is well-controlled on omeprazole daily. Denies chest tightness and wheezing.  Hx of DVT/PE in mom and aunts.  Reviewed Cardiology 02/17/17: Followed for palpitations and started on Flecainide. 48Hr Holter 06/10/17: Focal atrial tachycardia rec Chronic Cough -CXR today -STOP Lisinopril x 1 month -Follow-up with PCP in 2 weeks for blood pressure check -We will need to schedule Pulmonary Function Test. Please contact our office for the best date for your work schedule -Cough syrup as needed -Zofran as needed for nausea related to cough syrup Allergies -Take Flonase 1 spray per nare daily       Date of Admission: 06/04/2018                      Date of Discharge: 06/07/2018 Admitting Physician: Moses MannersWilliam A Hensel, MD     Indication for Hospitalization: Community-acquired pneumonia with failed outpatient treatment  Discharge Diagnoses/Problem  List:  Community-acquired pneumonia Heart failure ruled out CKD Hypertension Sinus tachycardia Chronic cough Depression Migraines Insomnia GERD    Brief Hospital Course:   Community-acquired pneumonia He presented to the ED with cough and shortness of breath for 10 days and recent flu positive diagnosis.  He was admitted and initially treated with ceftriaxone ceftriaxone (3/19) and azithromycin (3/19) which were quickly broadened to vancomycin (3/20-3/21) and cefepime (3/20-3/22).  Over the next 2 days, he should significant improvement in his respiratory status and a decrease in his subjective "chest congestion".  He was discharged home with a 5-day course of azithromycin (3/21-3/25).  His last day of treatment is 3/25.  Congestive heart failure ruled out There is initial concern for fluid overload and heart failure due to his presentation with shortness of breath and crackles with lower extremity edema.  IV Lasix 20 mg was given day of admission significant diuresis.  Echocardiogram on 3/20 showed no evidence of systolic, diastolic left-sided or right-sided heart failure and demonstrated a compressible IVC.  His lower extremity edema showed improvement before discharge.  There is no evidence to strongly support that he suffers from congestive heart failure at this time.  No medications for heart failure were started while in hospital. D/c meds TAKE these medications   azithromycin 250 MG tablet Commonly known as:  ZITHROMAX Take 1 tablet (250 mg total) by mouth daily for 3 days.   buPROPion 150 MG 24 hr tablet Commonly known as:  WELLBUTRIN XL Take 150 mg by mouth daily.   gabapentin 300 MG capsule Commonly known as:  NEURONTIN Take 300 mg by mouth daily.   HYDROcodone-homatropine 5-1.5 MG/5ML syrup Commonly known as:  HYCODAN Take 5 mLs by mouth every 6 (six) hours as needed for cough. What changed:  Another medication with the same name was added. Make sure you  understand how and when to take each.   HYDROcodone-homatropine 5-1.5 MG/5ML syrup Commonly known as:  HYCODAN Take 5 mLs by mouth every 6 (six) hours as needed for cough. What changed:  You were already taking a medication with the same name, and this prescription was added. Make sure you understand how and when to take each.   Loratadine 10 MG Caps Take 10 mg by mouth daily.   Melatonin 5 MG Tabs Take 5 mg by mouth at bedtime.   meloxicam 7.5 MG tablet Commonly known as:  MOBIC Take 7.5 mg by mouth 2 (two) times daily.   methocarbamol 500 MG tablet Commonly known as:  ROBAXIN Take 500 mg by mouth daily as needed for muscle spasms.   multivitamin tablet Take 1 tablet by mouth daily.   omeprazole 40 MG capsule Commonly known as:  PRILOSEC Take 40 mg by mouth daily.   ondansetron 4 MG tablet Commonly known as:  Zofran Take 1 tablet (4 mg total) by mouth every 8 (eight) hours as needed for nausea or vomiting.   REGLAN PO Take 1 tablet by mouth daily as needed (nausea/vomiting).   Spiriva Respimat 1.25 MCG/ACT Aers Generic drug:  Tiotropium Bromide Monohydrate Inhale 2 puffs into the lungs daily.   verapamil 180 MG CR tablet Commonly known as:  CALAN-SR Take 180 mg by mouth daily.       06/10/2018 Televisit/ Jack Lawson  Patient was scheduled for a tele-visit today for a hospital follow-up.  He was admitted to the hospital on 06/04/2018 with community-acquired pneumonia after being diagnosed with influenza.  He was treated with vancomycin, cefepime, ceftriaxone, and azithromycin at discharge.  He also had lower extremity edema and was initially treated with IV Lasix but echo on 3/20 showed no evidence of systolic or diastolic left-sided or right-sided heart failure.  The ED doctor noted that there was no evidence to strongly support a diagnosis of congestive heart failure at that time.    Patient states since discharge his symptoms have worsened.  He states  that his chest congestion and cough have worsened and lower extremity edema has returned.  He went to urgent care yesterday (06/10/18) and states that chest x-ray showed worsening pneumonia.  He was prescribed Levaquin and prednisone taper as well as Lasix 20 mg daily for peripheral edema.  He states that he is much improved today and is afebrile today. Denies f/c/s, n/v/d, hemoptysis, PND, leg swelling.   Observations/Objective: CT chest 06/06/18 - Mild bilateral patchy nodular airspace process likely representing atypical infectious or inflammatory process. Minimal mediastinal adenopathy likely reactive. Recommend follow-up CT 4 weeks. 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 Recommend annual imaging followup by CTA or MRA.  CXR 06/04/18 - Cardiomegaly with vascular congestion. Patchy interstitial and alveolar infiltrate at the left base  Assessment and Plan: Patient states since hospital discharge his symptoms have worsened.  He states that his chest congestion and cough have worsened and lower extremity edema.  He went to urgent care yesterday and states that chest x-ray showed worsening pneumonia.  He was prescribed Levaquin and prednisone taper as well as Lasix 20 mg daily for peripheral edema.  He states that he is much improved today  and is afebrile today. Will recommend continuing treatment by urgent care with close follow up with [name] this week.   Patient Instructions  Continue Levaquin Continue prednisone Continue lasix Keep legs elevated as much as possible Low sodium diet   Follow Up Instructions:  Follow up with [name] in 2 days If symptoms worsen please go to the ED    06/12/2018  Extended  ov/Jack Lawson re: consultation/ second opinion re chronic cough/ acutely worse since mid feb 2020 in setting of documented influenza with neg COVID-19 testing Chief Complaint  Patient presents with  . Consult  Never smoker grew [place] up with  sneezing/runny/nose/cough as far back as he remember eval by allergist in his 3's by [place] allergy with pos mold/grass improved  with zyrtec but still had nasal drainage mostly daytime changed to clariton when zyrtec quit working which helped / flonase made drainage while on acei x decades stopped last pill 05/15/18 at Dr George Hugh rec and no better on cough syrup then developed fever worse cough aches 05/26/2018 pos Influenza > Tamiflu    Started rx 05/28/18 > back [facility] 05/30/18 > overnight rx doxy/ zmax Levaquin > fever resolved / aches resolved but cough and breathing worse so admitted as above with dx of pna.  On way home from above admit started back with bad cough/ fever > UC 06/09/18 and started back on levaquin / prednisone.    Dyspnea:  Ok if not coughing Cough: non productive, still has sensation of pnds/ cough on insp/ better with "short acting tussionex" Sleeping: last able to sleep flat 3 weeks prior to OV  p clonapin and melatonin and ambien SABA use: no better on spiriva   rec Finish your levaquin,  Stop cough syrup, spiriva and zyrtec  Continue furosemide 20 mg daily  Leave off gabapentin 300 mg for a week to see what happens with the swelling first  The key to effective treatment for your cough is eliminating the non-stop cycle of cough First take delsym two tsp every 12 hours and supplement if needed with  hyrdocone 10 mg  every 4 hours to suppress the urge to cough at all or even clear your throat. Once you have eliminated the cough for 3 straight days try reducing the hydrocodone first,  then the delsym as tolerated.   When you finish the Prednisone 50 start  10 mg take  4 each am x 2 days,   2 each am x 2 days,  1 each am x 2 days and stop (this is to eliminate allergies and inflammation from coughing) Omeprazole 40 mg Take 30- 60 min before your first and last meals of the day  For drainage / throat tickle try take CHLORPHENIRAMINE  4 mg - take one every 4 hours as needed -  available over the counter  GERD diet         Virtual Visit via Telephone Note 07/06/2018   I connected with Jack Lawson on 07/06/18 at  4:30 PM EDT by telephone and verified that I am speaking with the correct person using two identifiers.   I discussed the limitations, risks, security and privacy concerns of performing an evaluation and management service by telephone and the availability of in person appointments. I also discussed with the patient that there may be a patient responsible charge related to this service. The patient expressed understanding and agreed to proceed.   History of Present Illness: 99% improved while on  oxycodone, off it x sev days and starting to have urge to clear throat  Dyspnea:  Unless coughing, Not limited by breathing from desired activities    Cough: after supper seems worse despite ppi bid ac  Sleeping: able to lie flat / not typically waking up with cough  SABA use: none  02:  No    No obvious other patterns in day to day or daytime variability or assoc excess/ purulent sputum or mucus plugs or hemoptysis or cp or chest tightness, subjective wheeze or overt sinus or hb symptoms.    Also denies any obvious fluctuation of symptoms with weather or environmental changes or other aggravating or alleviating factors except as outlined above.   Meds reviewed/ med reconciliation completed        Observations/Objective: Talks in full sentences/ occ throat clearing    Assessment and Plan: See problem list for active a/p's   Follow Up Instructions: See avs for instructions unique to this ov which includes revised/ updated med list     I discussed the assessment and treatment plan with the patient. The patient was provided an opportunity to ask questions and all were answered. The patient agreed with the plan and demonstrated an understanding of the instructions.   The patient was advised to call back or seek an in-person evaluation if the  symptoms worsen or if the condition fails to improve as anticipated.  I provided 25 minutes of non-face-to-face time during this encounter.   Sandrea Hughs, MD

## 2018-07-06 NOTE — Patient Instructions (Addendum)
Increase chlorpheniramine to 4 mg x 2 at bedtime   Hydrocodone 10 mg at bedtime if needed   GERD (REFLUX)  is an extremely common cause of respiratory symptoms just like yours , many times with no obvious heartburn at all.    It can be treated with medication, but also with lifestyle changes including elevation of the head of your bed (ideally with 6 -8inch blocks under the headboard of your bed),  Smoking cessation, avoidance of late meals, excessive alcohol, and avoid fatty foods, chocolate, peppermint, colas, red wine, and acidic juices such as orange juice.  NO MINT OR MENTHOL PRODUCTS SO NO COUGH DROPS  USE SUGARLESS CANDY INSTEAD (Jolley ranchers or Stover's or Life Savers) or even ice chips will also do - the key is to swallow to prevent all throat clearing. NO OIL BASED VITAMINS - use powdered substitutes.  Avoid fish oil when coughing.     >>> Needs f/u ov in 2 weeks with cxr , esr, cbc with diff/ allergy profile - bring all meds

## 2018-07-07 ENCOUNTER — Encounter: Payer: Self-pay | Admitting: Internal Medicine

## 2018-07-07 ENCOUNTER — Telehealth: Payer: Self-pay

## 2018-07-07 NOTE — Assessment & Plan Note (Signed)
Echo nl 06/05/2018 Developed p started gabapentin > 06/12/2018 rec trial off x one week and use furosemide 20 mg daily prn > improved as of 07/06/2018   Not clear whether he can tol gabapentin in any dose, will address this issue at f/u ov

## 2018-07-07 NOTE — Assessment & Plan Note (Signed)
Pos Influenza/ neg Covid-19 testing 05/26/2018 - cxr improved 06/12/2018   rec f/u with cxr in 2 weeks / no further abx/ check esr/cbci with diff

## 2018-07-07 NOTE — Assessment & Plan Note (Signed)
Onset ? Childhood - Allergy eval by [facility] pos mold/grass > never resolved pnds - 05/15/2018 ACEi d/c by Mechele Collin, MD - 07/06/2018 responded on to oxycodone, worsening off it   Over the phone it sounds like he's doing everything asked and starting to relapse so needs a short term way to control the cough until  COVID - 19 restrictions have been lifted then rechallenge with gabapentin / allergy testing/ MCT and possible refer to [facility] voice center.  For now rec max rx with 1st gen H1 blockers per guidelines

## 2018-07-07 NOTE — Telephone Encounter (Signed)
LMTCB. Please make patient a 2 week f/u appt with [name] in office per MW.

## 2018-07-09 ENCOUNTER — Ambulatory Visit: Payer: 59 | Admitting: Internal Medicine

## 2018-07-13 ENCOUNTER — Ambulatory Visit: Payer: 59 | Admitting: Internal Medicine

## 2018-07-13 ENCOUNTER — Encounter: Payer: Self-pay | Admitting: Internal Medicine

## 2018-07-13 ENCOUNTER — Ambulatory Visit (INDEPENDENT_AMBULATORY_CARE_PROVIDER_SITE_OTHER): Payer: 59

## 2018-07-13 ENCOUNTER — Other Ambulatory Visit: Payer: Self-pay

## 2018-07-13 VITALS — BP 144/82 | HR 96 | Temp 98.2°F | Ht 72.0 in | Wt 232.4 lb

## 2018-07-13 DIAGNOSIS — R05 Cough: Secondary | ICD-10-CM

## 2018-07-13 DIAGNOSIS — J181 Lobar pneumonia, unspecified organism: Secondary | ICD-10-CM | POA: Diagnosis not present

## 2018-07-13 DIAGNOSIS — R058 Other specified cough: Secondary | ICD-10-CM

## 2018-07-13 DIAGNOSIS — M7989 Other specified soft tissue disorders: Secondary | ICD-10-CM | POA: Diagnosis not present

## 2018-07-13 DIAGNOSIS — J189 Pneumonia, unspecified organism: Secondary | ICD-10-CM

## 2018-07-13 LAB — CBC WITH DIFFERENTIAL/PLATELET
Basophils Absolute: 0 10*3/uL (ref 0.0–0.1)
Basophils Relative: 0.5 % (ref 0.0–3.0)
Eosinophils Absolute: 0.3 10*3/uL (ref 0.0–0.7)
Eosinophils Relative: 3.2 % (ref 0.0–5.0)
HCT: 43.5 % (ref 39.0–52.0)
Hemoglobin: 14.7 g/dL (ref 13.0–17.0)
Lymphocytes Relative: 40.9 % (ref 12.0–46.0)
Lymphs Abs: 3.6 10*3/uL (ref 0.7–4.0)
MCHC: 33.7 g/dL (ref 30.0–36.0)
MCV: 87 fl (ref 78.0–100.0)
Monocytes Absolute: 1 10*3/uL (ref 0.1–1.0)
Monocytes Relative: 11.7 % (ref 3.0–12.0)
Neutro Abs: 3.9 10*3/uL (ref 1.4–7.7)
Neutrophils Relative %: 43.7 % (ref 43.0–77.0)
Platelets: 375 10*3/uL (ref 150.0–400.0)
RBC: 5 Mil/uL (ref 4.22–5.81)
RDW: 14 % (ref 11.5–15.5)
WBC: 8.9 10*3/uL (ref 4.0–10.5)

## 2018-07-13 LAB — BASIC METABOLIC PANEL
BUN: 15 mg/dL (ref 6–23)
CO2: 31 mEq/L (ref 19–32)
Calcium: 9.7 mg/dL (ref 8.4–10.5)
Chloride: 99 mEq/L (ref 96–112)
Creatinine, Ser: 1.22 mg/dL (ref 0.40–1.50)
GFR: 62.52 mL/min (ref >60.00)
Glucose, Bld: 88 mg/dL (ref 70–99)
Potassium: 4.1 mEq/L (ref 3.5–5.1)
Sodium: 137 mEq/L (ref 135–145)

## 2018-07-13 LAB — SEDIMENTATION RATE: Sed Rate: 18 mm/hr (ref 0–20)

## 2018-07-13 MED ORDER — FUROSEMIDE 20 MG PO TABS: 20.0000 mg | ORAL_TABLET | Freq: Every day | ORAL | 2 refills | Status: DC

## 2018-07-13 MED ORDER — MONTELUKAST SODIUM 10 MG PO TABS: 10.0000 mg | ORAL_TABLET | Freq: Every day | ORAL | 11 refills | Status: DC

## 2018-07-13 MED ORDER — GABAPENTIN 300 MG PO CAPS: 300.0000 mg | ORAL_CAPSULE | Freq: Two times a day (BID) | ORAL | 2 refills | Status: DC

## 2018-07-13 NOTE — Patient Instructions (Signed)
Singulair 10 mg every pm and stop zyrtec and continue clariton daily   Increase gabapentin 300mg  twice daily   For drainage / throat tickle try take CHLORPHENIRAMINE  4 mg  (Chlortab 4mg   at [employer] [employer] should be easiest to find in the green box)  take one-two  every 4 hours as needed - available over the counter- may cause drowsiness so start with just a bedtime dose or two and see how you tolerate it before trying in daytime    For leg swelling > take furosemide 20 mg daily   Please remember to go to the lab and x-ray department   for your tests - we will call you with the results when they are available.   Please schedule a follow up office visit in 4 weeks, sooner if needed  with all medications /inhalers/ solutions in hand so we can verify exactly what you are taking. This includes all medications from all doctors and over the counters

## 2018-07-13 NOTE — Progress Notes (Signed)
Subjective:   PATIENT ID: Jack Lawson GENDER: male DOB: 1965/05/05  MRN: [id number]    New pt eval/ Dr Loanne Drilling  05/15/2018  Reason for Visit: New consult for chronic cough  74 yowm  never smoker with atrial arrhythmias, hypertension, migraine who presents for chronic cough.  He has had chronic cough for over a year and has been treated with multiple rounds of antibiotics (at least 5) without improvement. He initially had symptoms of bronchitis. His cough is mainly dry and hacking in character and nonproductive and associated with sore throat. He feels an internal irritation that stimulates his cough. He noticed after laughing he would have severe coughing fits. In the last few months cough will occur without laughing. When lying down, cough will worsen. It interferes with his sleep. Has tried over the counter medications and tessalon perles. Triggered by cold air, activity, bending over, laying over. Symptoms only improve with time. Reports allergies all year-round and taking claritin. Has post nasal drip. Reports reflux which is well-controlled on omeprazole daily. Denies chest tightness and wheezing.  Hx of DVT/PE in mom and aunts.  Reviewed Cardiology 02/17/17: Followed for palpitations and started on Flecainide. 48Hr Holter 06/10/17: Focal atrial tachycardia rec Chronic Cough -STOP Lisinopril x 1 month -We will need to schedule Pulmonary Function Test. Please contact our office for the best date for your work schedule -Cough syrup as needed -Zofran as needed for nausea related to cough syrup Allergies -Take Flonase 1 spray per nare daily       Date of Admission: 06/04/2018                      Date of Discharge: 06/07/2018      Indication for Hospitalization: Community-acquired pneumonia with failed outpatient treatment  Discharge Diagnoses/Problem List:  Community-acquired pneumonia Heart failure ruled out CKD Hypertension Sinus tachycardia Chronic  cough Depression Migraines Insomnia GERD    Brief Hospital Course:   Community-acquired pneumonia He presented to the ED with cough and shortness of breath for 10 days and recent flu positive diagnosis.  He was admitted and initially treated with ceftriaxone ceftriaxone (3/19) and azithromycin (3/19) which were quickly broadened to vancomycin (3/20-3/21) and cefepime (3/20-3/22).  Over the next 2 days, he should significant improvement in his respiratory status and a decrease in his subjective "chest congestion".  He was discharged home with a 5-day course of azithromycin (3/21-3/25).  His last day of treatment is 3/25.  Congestive heart failure ruled out There is initial concern for fluid overload and heart failure due to his presentation with shortness of breath and crackles with lower extremity edema.  IV Lasix 20 mg was given day of admission significant diuresis.  Echocardiogram on 3/20 showed no evidence of systolic, diastolic left-sided or right-sided heart failure and demonstrated a compressible IVC.  His lower extremity edema showed improvement before discharge.  There is no evidence to strongly support that he suffers from congestive heart failure at this time.  No medications for heart failure were started while in hospital. D/c meds TAKE these medications   azithromycin 250 MG tablet Commonly known as:  ZITHROMAX Take 1 tablet (250 mg total) by mouth daily for 3 days.   buPROPion 150 MG 24 hr tablet Commonly known as:  WELLBUTRIN XL Take 150 mg by mouth daily.   gabapentin 300 MG capsule Commonly known as:  NEURONTIN Take 300 mg by mouth daily.   HYDROcodone-homatropine 5-1.5 MG/5ML syrup Commonly known as:  HYCODAN Take 5 mLs by mouth every 6 (six) hours as needed for cough. What changed:  Another medication with the same name was added. Make sure you understand how and when to take each.   HYDROcodone-homatropine 5-1.5 MG/5ML syrup Commonly known as:   HYCODAN Take 5 mLs by mouth every 6 (six) hours as needed for cough. What changed:  You were already taking a medication with the same name, and this prescription was added. Make sure you understand how and when to take each.   Loratadine 10 MG Caps Take 10 mg by mouth daily.   Melatonin 5 MG Tabs Take 5 mg by mouth at bedtime.   meloxicam 7.5 MG tablet Commonly known as:  MOBIC Take 7.5 mg by mouth 2 (two) times daily.   methocarbamol 500 MG tablet Commonly known as:  ROBAXIN Take 500 mg by mouth daily as needed for muscle spasms.   multivitamin tablet Take 1 tablet by mouth daily.   omeprazole 40 MG capsule Commonly known as:  PRILOSEC Take 40 mg by mouth daily.   ondansetron 4 MG tablet Commonly known as:  Zofran Take 1 tablet (4 mg total) by mouth every 8 (eight) hours as needed for nausea or vomiting.   REGLAN PO Take 1 tablet by mouth daily as needed (nausea/vomiting).   Spiriva Respimat 1.25 MCG/ACT Aers Generic drug:  Tiotropium Bromide Monohydrate Inhale 2 puffs into the lungs daily.   verapamil 180 MG CR tablet Commonly known as:  CALAN-SR Take 180 mg by mouth daily.       06/10/2018 Televisit/ [name]  Patient was scheduled for a tele-visit today for a hospital follow-up.  He was admitted to the hospital on 06/04/2018 with community-acquired pneumonia after being diagnosed with influenza.  He was treated with vancomycin, cefepime, ceftriaxone, and azithromycin at discharge.  He also had lower extremity edema and was initially treated with IV Lasix but echo on 3/20 showed no evidence of systolic or diastolic left-sided or right-sided heart failure.  The ED doctor noted that there was no evidence to strongly support a diagnosis of congestive heart failure at that time.    Patient states since discharge his symptoms have worsened.  He states that his chest congestion and cough have worsened and lower extremity edema has returned.  He went to urgent  care yesterday (06/10/18) and states that chest x-ray showed worsening pneumonia.  He was prescribed Levaquin and prednisone taper as well as Lasix 20 mg daily for peripheral edema.  He states that he is much improved today and is afebrile today. Denies f/c/s, n/v/d, hemoptysis, PND, leg swelling.   Observations/Objective: CT chest 06/06/18 - Mild bilateral patchy nodular airspace process likely representing atypical infectious or inflammatory process. Minimal mediastinal adenopathy likely reactive. Recommend follow-up CT 4 weeks. 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 Recommend annual imaging followup by CTA or MRA.  CXR 06/04/18 - Cardiomegaly with vascular congestion. Patchy interstitial and alveolar infiltrate at the left base  Assessment and Plan: Patient states since hospital discharge his symptoms have worsened.  He states that his chest congestion and cough have worsened and lower extremity edema.  He went to urgent care yesterday and states that chest x-ray showed worsening pneumonia.  He was prescribed Levaquin and prednisone taper as well as Lasix 20 mg daily for peripheral edema.  He states that he is much improved today and is afebrile today. Will recommend continuing treatment by urgent care with close follow up with Dr.  Keyvon Herter this week.   Patient Instructions  Continue Levaquin Continue prednisone Continue lasix Keep legs elevated as much as possible Low sodium diet   Follow Up Instructions:  Follow up with Dr. Melvyn Novas in 2 days If symptoms worsen please go to the ED    06/12/2018  Extended  ov/Zalma Channing re: consultation/ second opinion re chronic cough/ acutely worse since mid feb 2020 in setting of documented influenza with neg COVID-19 testing Chief Complaint  Patient presents with   Consult  Never smoker grew [place]  [place] up with sneezing/runny/nose/cough as far back as he remember eval by allergist in his 42's by [place] allergy with pos  mold/grass improved  with zyrtec but still had nasal drainage mostly daytime changed to clariton when zyrtec quit working which helped / flonase made drainage while on acei x decades stopped last pill 05/15/18 at Dr Cordelia Pen rec and no better on cough syrup then developed fever worse cough aches 05/26/2018 pos Influenza > Tamiflu    Started rx 05/28/18 > back [place] [facility] 05/30/18 > overnight rx doxy/ zmax Levaquin > fever resolved / aches resolved but cough and breathing worse so admitted as above with dx of pna.  On way home from above admit started back with bad cough/ fever > UC 06/09/18 and started back on levaquin / prednisone.    Dyspnea:  Ok if not coughing Cough: non productive, still has sensation of pnds/ cough on insp/ better with "short acting tussionex" Sleeping: last able to sleep flat 3 weeks prior to OV  p clonapin and melatonin and ambien SABA use: no better on spiriva  rec  Finish your levaquin,  Stop cough syrup, spiriva and zyrtec  Continue furosemide 20 mg daily  Leave off gabapentin 300 mg for a week to see what happens with the swelling first  First take delsym two tsp every 12 hours and supplement if needed with  hyrdocone 10 mg  every 4 hours to suppress the urge to cough at all or even clear your throat.  Once you have eliminated the cough for 3 straight days try reducing the hydrocodone first,  then the delsym as tolerated.   When you finish the Prednisone 50 start  10 mg take  4 each am x 2 days,   2 each am x 2 days,  1 each am x 2 days and stop (this is to eliminate allergies and inflammation from coughing) Omeprazole 40 mg Take 30- 60 min before your first and last meals of the day  For drainage / throat tickle try take CHLORPHENIRAMINE  4 mg - take one every 4 hours as needed - available over the counter- may cause drowsiness so start with just a bedtime dose or two and see how you tolerate it before trying in daytime   GERD diet    07/06/2018 Increase chlorpheniramine  to 4 mg x 2 at bedtime  Hydrocodone 10 mg at bedtime if needed  GERD diet  >>> Needs f/u ov in 2 weeks with cxr , esr, cbc with diff/ allergy profile - bring all meds    07/13/2018  f/u ov/Jack Lawson re: uacs / did not bring  Chief Complaint  Patient presents with   Follow-up    Cough has improved some but not completely resolved.    Dyspnea:  Not limited by breathing from desired activities  /steps ok Cough: some sense of pnds not as bad, using clariton helps   Sleeping: resp wise fine always after chlorpheniraamine and  hydrocodonone  x 20 mg Ok flat > recliner for back sometimes  SABA use: none 02: none   No obvious day to day or daytime variability or assoc excess/ purulent sputum or mucus plugs or hemoptysis or cp or chest tightness, subjective wheeze or overt  hb symptoms.   Sleeping as above without nocturnal  or early am exacerbation  of respiratory  c/o's or need for noct saba. Also denies any obvious fluctuation of symptoms with weather or environmental changes or other aggravating or alleviating factors except as outlined above   No unusual exposure hx or h/o childhood pna/ asthma or knowledge of premature birth.  Current Allergies, Complete Past Medical History, Past Surgical History, Family History, and Social History were reviewed in [employer] record.  ROS  The following are not active complaints unless bolded Hoarseness, sore throat, dysphagia, dental problems, itching, sneezing,  nasal congestion or discharge of excess mucus or purulent secretions, ear ache,   fever, chills, sweats, unintended wt loss or wt gain, classically pleuritic or exertional cp,  orthopnea pnd or arm/hand swelling  or leg swelling, presyncope, palpitations, abdominal pain, anorexia, nausea, vomiting, diarrhea  or change in bowel habits or change in bladder habits, change in stools or change in urine, dysuria, hematuria,  rash, arthralgias, visual complaints, headache, numbness,  weakness or ataxia or problems with walking or coordination,  change in mood or  memory.        Current Meds  Plus gabapenint 300 mg q hs   Medication Sig   buPROPion (WELLBUTRIN XL) 150 MG 24 hr tablet Take 150 mg by mouth daily.    cetirizine (ZYRTEC) 10 MG tablet Take 10 mg by mouth daily.   chlorpheniramine (CHLOR-TRIMETON) 4 MG tablet Take 8 mg by mouth at bedtime.   dextromethorphan (DELSYM) 30 MG/5ML liquid Take by mouth 2 (two) times daily as needed for cough.   furosemide (LASIX) 20 MG tablet Take 1 tablet (20 mg total) by mouth daily.   HYDROcodone-acetaminophen (NORCO) 10-325 MG tablet Take 1 tablet by mouth every 4 (four) hours as needed (severe cough).   loratadine (CLARITIN) 10 MG tablet Take 10 mg by mouth daily.   Melatonin 5 MG TABS Take 5 mg by mouth at bedtime.    meloxicam (MOBIC) 7.5 MG tablet Take 7.5 mg by mouth 2 (two) times daily.    methocarbamol (ROBAXIN) 500 MG tablet Take 500 mg by mouth daily as needed for muscle spasms.   Metoclopramide HCl (REGLAN PO) Take 1 tablet by mouth daily as needed (nausea/vomiting).        Multiple Vitamin (MULTIVITAMIN) tablet Take 1 tablet by mouth daily.   omeprazole (PRILOSEC) 40 MG capsule Take 30- 60 min before your first and last meals of the day   ondansetron (ZOFRAN) 4 MG tablet Take 1 tablet (4 mg total) by mouth every 8 (eight) hours as needed for nausea or vomiting.   verapamil (CALAN-SR) 180 MG CR tablet Take 180 mg by mouth daily.                    Objective:   amb wm can now breath in deeply s coughing fits/ some throat clearing   07/13/2018       232   06/12/18 235 lb (106.6 kg)  06/06/18 238 lb 1.6 oz (108 kg)  05/15/18 243 lb 6.4 oz (110.4 kg)      Vital signs reviewed - Note on arrival 02 sats  97% on RA  HEENT: nl dentition, turbinates bilaterally, and oropharynx. Nl external ear canals without cough reflex   NECK :  without JVD/Nodes/TM/ nl carotid upstrokes  bilaterally   LUNGS: no acc muscle use,  Nl contour chest which is clear to A and P bilaterally without cough on insp or exp maneuvers   CV:  RRR  no s3 or murmur or increase in P2, and no edema   ABD:  soft and nontender with nl inspiratory excursion in the supine position. No bruits or organomegaly appreciated, bowel sounds nl  MS:  Nl gait/ ext warm without deformities, calf tenderness, cyanosis or clubbing No obvious joint restrictions   SKIN: warm and dry without lesions    NEURO:  alert, approp, nl sensorium with  no motor or cerebellar deficits apparent.   CXR PA and Lateral:   07/13/2018 :    I personally reviewed images and agree with radiology impression as follows:   Stable bibasilar reticulonodular opacities. This is nonspecific. Considerations include both chronic and acute inflammatory processes.   Labs ordered 07/13/2018    Allergy profile    Labs ordered/ reviewed:      Chemistry      Component Value Date/Time   NA 137 07/13/2018 1449   K 4.1 07/13/2018 1449   CL 99 07/13/2018 1449   CO2 31 07/13/2018 1449   BUN 15 07/13/2018 1449   CREATININE 1.22 07/13/2018 1449      Component Value Date/Time   CALCIUM 9.7 07/13/2018 1449        Lab Results  Component Value Date   WBC 8.9 07/13/2018   HGB 14.7 07/13/2018   HCT 43.5 07/13/2018   MCV 87.0 07/13/2018   PLT 375.0 07/13/2018       EOS                                                              0.3                                    07/13/2018       Lab Results  Component Value Date   ESRSEDRATE 18 07/13/2018          Assessment & Plan:

## 2018-07-14 ENCOUNTER — Encounter: Payer: Self-pay | Admitting: Internal Medicine

## 2018-07-14 LAB — RESPIRATORY ALLERGY PROFILE REGION II ~~LOC~~
Allergen, A. alternata, m6: <0.1 kU/L
Allergen, Cedar tree, t12: <0.1 kU/L
Allergen, Comm Silver Birch, t9: <0.1 kU/L
Allergen, Cottonwood, t14: <0.1 kU/L
Allergen, D pternoyssinus,d7: <0.1 kU/L
Allergen, Mouse Urine Protein, e78: <0.1 kU/L
Allergen, Mulberry, t76: <0.1 kU/L
Allergen, Oak,t7: <0.1 kU/L
Allergen, P. notatum, m1: <0.1 kU/L
Aspergillus fumigatus, m3: <0.1 kU/L
Bermuda Grass: <0.1 kU/L
Box Elder IgE: <0.1 kU/L
CLADOSPORIUM HERBARUM (M2) IGE: <0.1 kU/L
COMMON RAGWEED (SHORT) (W1) IGE: <0.1 kU/L
Cat Dander: <0.1 kU/L
Class: 0
Class: 0
Class: 0
Class: 0
Class: 0
Class: 0
Class: 0
Class: 0
Class: 0
Class: 0
Class: 0
Class: 0
Class: 0
Class: 0
Class: 0
Class: 0
Class: 0
Class: 0
Class: 0
Class: 0
Class: 0
Class: 0
Class: 0
Class: 0
Cockroach: <0.1 kU/L
D. farinae: <0.1 kU/L
Dog Dander: <0.1 kU/L
Elm IgE: <0.1 kU/L
IgE (Immunoglobulin E), Serum: 4 kU/L (ref <=114)
Johnson Grass: 0.13 kU/L — ABNORMAL HIGH
Pecan/Hickory Tree IgE: <0.1 kU/L
Rough Pigweed  IgE: <0.1 kU/L
Sheep Sorrel IgE: <0.1 kU/L
Timothy Grass: 0.27 kU/L — ABNORMAL HIGH

## 2018-07-14 LAB — INTERPRETATION:

## 2018-07-14 NOTE — Assessment & Plan Note (Signed)
Onset ? Childhood - Allergy eval by [place] 2000[facility] pos mold/grass > never resolved pnds - 05/15/2018 ACEi d/c by Mechele Collin, MD - 07/06/2018 responded on to oxycodone, worsening off it   - Allergy profile 07/13/2018 >  Eos 0.3 /  IgE   - Singulair 10 mg 07/13/2018 >>> - gabapentin 300 mg bid resarted for pnds plus prn 1st gen H1 blockers per guidelines    At this point he is much better but still apparently dependent on narcotic cough medications which should have been stopped after 3 days control.  I am going to challenge him again with the gabapentin to see if it controls the cough without adding to leg swelling which was a potential side effect the first time around.  Will also try singulair to see if helps with pnds as does not recall ever trying it.

## 2018-07-14 NOTE — Progress Notes (Signed)
Spoke with pt and notified of results per Dr. Wert. Pt verbalized understanding and denied any questions. 

## 2018-07-14 NOTE — Assessment & Plan Note (Addendum)
Pos Influenza/ neg Covid-19 testing 05/26/2018 - cxr improved 06/12/2018 > minimal residual RN changes 07/13/2018 with esr =18  He is no longer coughing on deep inspiratory maneuvers with baseline exercise tolerance restored therefore no further work-up needed for interstitial lung disease as this is consistent with a post influenza pattern.    I had an extended discussion with the patient reviewing all relevant studies completed to date and  lasting 15 to 20 minutes of a 25 minute visit    Each maintenance medication was reviewed in detail including most importantly the difference between maintenance and prns and under what circumstances the prns are to be triggered using an action plan format that is not reflected in the computer generated alphabetically organized AVS.     Please see AVS for specific instructions unique to this visit that I personally wrote and verbalized to the the pt in detail and then reviewed with pt  by my nurse highlighting any  changes in therapy recommended at today's visit to their plan of care.

## 2018-07-14 NOTE — Assessment & Plan Note (Signed)
Echo nl 06/05/2018 Developed p started gabapentin > 06/12/2018 rec trial off x one week and use furosemide 20 mg daily prn > improved as of 07/06/2018 > resolved 07/13/2018 but rechallenged with gabapentin due to narc dep coughing   I added back as needed Lasix 20 mg daily in the event that the swelling recurs on gabapentin.

## 2018-07-14 NOTE — Telephone Encounter (Signed)
Patient was seen in office 07/13/18. Nothing further is needed at this time.

## 2018-12-22 ENCOUNTER — Other Ambulatory Visit: Payer: Self-pay | Admitting: Internal Medicine

## 2018-12-22 DIAGNOSIS — R05 Cough: Secondary | ICD-10-CM

## 2018-12-22 DIAGNOSIS — R058 Other specified cough: Secondary | ICD-10-CM

## 2020-06-28 ENCOUNTER — Encounter: Payer: Self-pay | Admitting: Gastroenterology

## 2020-07-26 ENCOUNTER — Encounter: Payer: Self-pay | Admitting: Gastroenterology

## 2020-07-26 ENCOUNTER — Encounter: Payer: Self-pay | Admitting: *Deleted

## 2020-07-26 ENCOUNTER — Ambulatory Visit (INDEPENDENT_AMBULATORY_CARE_PROVIDER_SITE_OTHER): Payer: 59 | Admitting: Gastroenterology

## 2020-07-26 ENCOUNTER — Other Ambulatory Visit: Payer: Self-pay

## 2020-07-26 VITALS — BP 164/98 | HR 73 | Ht 72.0 in | Wt 297.3 lb

## 2020-07-26 DIAGNOSIS — R945 Abnormal results of liver function studies: Secondary | ICD-10-CM

## 2020-07-26 DIAGNOSIS — Z86010 Personal history of colonic polyps: Secondary | ICD-10-CM | POA: Diagnosis not present

## 2020-07-26 DIAGNOSIS — R635 Abnormal weight gain: Secondary | ICD-10-CM

## 2020-07-26 DIAGNOSIS — K219 Gastro-esophageal reflux disease without esophagitis: Secondary | ICD-10-CM

## 2020-07-26 DIAGNOSIS — K582 Mixed irritable bowel syndrome: Secondary | ICD-10-CM | POA: Diagnosis not present

## 2020-07-26 DIAGNOSIS — R7989 Other specified abnormal findings of blood chemistry: Secondary | ICD-10-CM

## 2020-07-26 MED ORDER — DICYCLOMINE HCL 10 MG PO CAPS: 10.0000 mg | ORAL_CAPSULE | Freq: Two times a day (BID) | ORAL | 6 refills | Status: DC

## 2020-07-26 NOTE — Progress Notes (Signed)
Chief Complaint: Multiple GI problems.  Referring Provider:  Cher Nakai, MD      ASSESSMENT AND PLAN;   #1. GERD despite PPIs.   #2. IBS- alt diarrhea/constipation.  #3. Abn LFTs s/o fatty liver. R/O other causes.  #4. H/O polyps. FH of colonic polyps (dad)  Plan: -Protonix 48m po bid to continue for now. -EGD for further evaluation. -[place]  -Dicyclomine 170mpo bid #60, 6 refills -Recall Colon 04/2022. -If LFTs are persistently abn, further metabolic, viral and serologic work-up.   HPI:    Jack COVELLIs a 89.o. male  With multiple comorbid conditions including HTN, Wt Gain (not tested for OSA), fibromyalgia, migraine, insomnia, RLS, PSVT, generalized OA, peripheral neuropathy  C/O GERD x over 15-20 yrs, has been on omeprazole, stopped d/t ?wt gain. No dysphagia or odynophagia.  Has been having regurgitation. C/O cough x since he had pneumonia 2 years ago.  Has been advised by pulmonary to get UGI evaluation.  H/O IBS with alternating diarrhea and constipation x 15 yrs. Better with dicyclomine.  Denies having any excessive bloating.  No fever or chills.  Colonoscopy as below.  No abdominal pain currently.  Has gained weight as detailed below.  He attributes that to the medications.  On review he has been on tapering steroids in the past.  He told me that he was exercising.  Wt Readings from Last 3 Encounters:  07/26/20 297 lb 5 oz (134.9 kg)  07/13/18 232 lb 6.4 oz (105.4 kg)  06/12/18 235 lb (106.6 kg)   Past GI work-up: -Colonoscopy 12/2017: ([facility])Colonic polyp s/p polypectomy.  Moderate sigmoid diverticulosis.  Bx- tubular adenoma.  Repeat in 5 years. -CT AP 11/2008 sigmoid diverticula without diverticulitis. -I was able to obtain labs from Dr. LeMarguerita Beardsffice: AST 50, ALT 88, alk phos 64, albumin 4.4, total bilirubin 0.21 December 2019.  Normal CBC with hemoglobin 14.4, platelets 261 Past Medical History:  Diagnosis Date  . ADD (attention  deficit disorder)   . Atrial arrhythmia   . Back pain   . Depression   . Elevated liver enzymes   . Esophageal reflux   . Family history of colon cancer   . Fibromyalgia   . GERD (gastroesophageal reflux disease)   . HTN (hypertension)   . Hyperlipidemia   . Hypogonadism in male   . IBS (irritable bowel syndrome)   . Insomnia   . Migraine headache   . Osteoarthrosis   . Palpitation   . Peripheral neuropathy, idiopathic   . PSVT (paroxysmal supraventricular tachycardia) ([place]  . RLS (restless legs syndrome)     Past Surgical History:  Procedure Laterality Date  . COLONOSCOPY  08/10/2009   Mild sigmoid diverticulosis. Otherwise normal colonoscopy to terminal ileum  . COLONOSCOPY  04/24/2017   Colonic polyp status post polypectomy. Moderate predominantly sigmoid diverticulosis.   . Marland KitchenIPOMA RESECTION      Family History  Problem Relation Age of Onset  . Deep vein thrombosis Mother   . Pulmonary embolism Mother   . Hypertension Mother   . CVA Mother   . Deep vein thrombosis Maternal Aunt   . Colon cancer Maternal Aunt   . Breast cancer Paternal Aunt   . Colon polyps Father     Social History   Tobacco Use  . Smoking status: Never Smoker  . Smokeless tobacco: Never Used  Vaping Use  . Vaping Use: Never used  Substance Use Topics  . Alcohol  use: Not Currently  . Drug use: Never    Current Outpatient Medications  Medication Sig Dispense Refill  . atenolol-chlorthalidone (TENORETIC) 100-25 MG tablet Take 1 tablet by mouth daily.    . cetirizine (ZYRTEC) 10 MG tablet Take 10 mg by mouth daily.    . fexofenadine (ALLEGRA) 180 MG tablet Take 180 mg by mouth daily.    . furosemide (LASIX) 40 MG tablet Take 40 mg by mouth 2 (two) times daily.    Marland Kitchen gabapentin (NEURONTIN) 600 MG tablet Take 600 mg by mouth 2 (two) times daily.    Marland Kitchen loratadine (CLARITIN) 10 MG tablet Take 10 mg by mouth daily.    Marland Kitchen losartan-hydrochlorothiazide (HYZAAR) 50-12.5 MG tablet Take 1 tablet by  mouth 2 (two) times daily.    . Melatonin 5 MG CHEW Chew 5 mg by mouth at bedtime. 2 tablets at bedtime    . meloxicam (MOBIC) 7.5 MG tablet Take 7.5 mg by mouth 2 (two) times daily.     . Multiple Vitamin (MULTIVITAMIN) tablet Take 1 tablet by mouth daily.    . pantoprazole (PROTONIX) 40 MG tablet Take 1 tablet by mouth 2 (two) times daily.    . pramipexole (MIRAPEX) 0.5 MG tablet Take 0.5 mg by mouth at bedtime.    . temazepam (RESTORIL) 15 MG capsule Take 15 mg by mouth at bedtime.    . verapamil (VERELAN PM) 180 MG 24 hr capsule Take 180 mg by mouth daily.     No current facility-administered medications for this visit.    Allergies  Allergen Reactions  . Codeine Nausea And Vomiting    Review of Systems:  Constitutional: Denies fever, chills, diaphoresis, appetite change and has fatigue.  HEENT: neg Respiratory: Denies SOB, DOE, cough, chest tightness,  and wheezing.   Cardiovascular: Denies chest pain, palpitations and leg swelling.  Genitourinary: Denies dysuria, urgency, frequency, hematuria, flank pain and difficulty urinating.  Musculoskeletal: Has myalgias, back pain, joint swelling, arthralgias and gait problem.  Skin: No rash.  Neurological: Denies dizziness, seizures, syncope, weakness, light-headedness, numbness and headaches.  Hematological: Denies adenopathy. Easy bruising, personal or family bleeding history  Psychiatric/Behavioral: Has anxiety or depression     Physical Exam:    BP (!) 164/98 (BP Location: Right Arm, Patient Position: Sitting, Cuff Size: Normal)   Pulse 73   Ht 6' (1.829 m)   Wt 297 lb 5 oz (134.9 kg)   BMI 40.32 kg/m  Wt Readings from Last 3 Encounters:  07/26/20 297 lb 5 oz (134.9 kg)  07/13/18 232 lb 6.4 oz (105.4 kg)  06/12/18 235 lb (106.6 kg)   Constitutional:  Well-developed, in no acute distress. Psychiatric: Normal mood and affect. Behavior is normal. HEENT: Pupils normal.  Conjunctivae are normal. No scleral icterus. Neck  supple.  Cardiovascular: Normal rate, regular rhythm. No edema Pulmonary/chest: Effort normal and breath sounds normal. No wheezing, rales or rhonchi. Abdominal: Soft, nondistended. Nontender. Bowel sounds active throughout. There are no masses palpable. No hepatomegaly. Rectal: Deferred Neurological: Alert and oriented to person place and time. Skin: Skin is warm and dry. No rashes noted.  Data Reviewed: I have personally reviewed following labs and imaging studies  CBC: CBC Latest Ref Rng & Units 07/13/2018 06/07/2018 06/06/2018  WBC 4.0 - 10.5 K/uL 8.9 9.2 10.3  Hemoglobin 13.0 - 17.0 g/dL 14.7 11.5(L) 11.6(L)  Hematocrit 39.0 - 52.0 % 43.5 36.3(L) 36.3(L)  Platelets 150.0 - 400.0 K/uL 375.0 348 357    CMP: CMP Latest Ref Rng &  Units 07/13/2018 06/07/2018 06/06/2018  Glucose 70 - 99 mg/dL 88 109(H) 100(H)  BUN 6 - 23 mg/dL '15 12 11  ' Creatinine 0.40 - 1.50 mg/dL 1.22 1.35(H) 1.33(H)  Sodium 135 - 145 mEq/L 137 137 136  Potassium 3.5 - 5.1 mEq/L 4.1 4.4 4.3  Chloride 96 - 112 mEq/L 99 104 104  CO2 19 - 32 mEq/L '31 26 23  ' Calcium 8.4 - 10.5 mg/dL 9.7 8.4(L) 8.4(L)   Labs reviewed as above   Carmell Austria, MD 07/26/2020, 10:28 AM  Cc: Cher Nakai, MD

## 2020-07-26 NOTE — Patient Instructions (Addendum)
If you are age 54 or older, your body mass index should be between 23-30. Your Body mass index is 40.32 kg/m. If this is out of the aforementioned range listed, please consider follow up with your Primary Care Provider.  If you are age 16 or younger, your body mass index should be between 19-25. Your Body mass index is 40.32 kg/m. If this is out of the aformentioned range listed, please consider follow up with your Primary Care Provider.   You have been scheduled for an abdominal ultrasound at [facility] [address] on     at        . Please arrive 15 minutes prior to your appointment for registration. Make certain not to have anything to eat or drink 6 hours prior to your appointment. Should you need to reschedule your appointment, please contact radiology at [phone]. This test typically takes about 30 minutes to perform.  It has been recommended to you by your physician that you have a(n) EGD completed. Per your request, we did not schedule the procedure(s) today. Please contact our office at [phone] should you decide to have the procedure completed. You will be scheduled for a pre-visit and procedure at that time.   You will be referred to a weight loss clinic. Please call in 3 weeks to check on the referral.  We have sent the following medications to your pharmacy for you to pick up at your convenience: Bentyl Continue Protonix  Recall colon set for 04-2022. You will get a reminder out in the mail.  Thank you,  Dr. Lynann Bologna

## 2020-07-27 ENCOUNTER — Encounter: Payer: Self-pay | Admitting: Internal Medicine

## 2020-07-27 ENCOUNTER — Other Ambulatory Visit: Payer: Self-pay

## 2020-07-27 ENCOUNTER — Ambulatory Visit (INDEPENDENT_AMBULATORY_CARE_PROVIDER_SITE_OTHER): Payer: 59

## 2020-07-27 ENCOUNTER — Ambulatory Visit: Payer: 59 | Admitting: Internal Medicine

## 2020-07-27 DIAGNOSIS — R058 Other specified cough: Secondary | ICD-10-CM | POA: Diagnosis not present

## 2020-07-27 DIAGNOSIS — R0600 Dyspnea, unspecified: Secondary | ICD-10-CM

## 2020-07-27 DIAGNOSIS — R0609 Other forms of dyspnea: Secondary | ICD-10-CM

## 2020-07-27 NOTE — Assessment & Plan Note (Signed)
Assoc  with progressive wt gain to peak of 294  - 07/27/2020   Walked on RA x one lap =  approx 250 ft -@ moderate pace stopped due to foot pain not sob sats 94% at end    Not able to reproduce 50 ft sob today, most likely suffering from a cycle of wt gain leading to less activity leading to more wt gain and now limited by ? Neuropathic foot pain which will further aggravate the problem  Rec: Wt loss (has referral already)  Water aerobics No pulmonary f/u needed

## 2020-07-27 NOTE — Assessment & Plan Note (Addendum)
Body mass index is 39.87 kg/m.  -  trending up still  No results found for: TSH   Contributing to gerd risk/ doe/reviewed the need and the process to achieve and maintain neg calorie balance > defer f/u primary care including intermittently monitoring thyroid status   Also at risk for osa now > referred to sleep medicine   Each maintenance medication was reviewed in detail including emphasizing most importantly the difference between maintenance and prns and under what circumstances the prns are to be triggered using an action plan format where appropriate.  Total time for H and P, chart review, counseling,  , directly observing portions of ambulatory 02 saturation study/ and generating customized AVS unique to this office visit / same day charting  > 30 min

## 2020-07-27 NOTE — Progress Notes (Signed)
Subjective:   PATIENT ID: Jack Lawson GENDER: male DOB: [birth year]  MRN: [id number]    New pt eval/ Dr Loanne Drilling  05/15/2018  Reason for Visit: New consult for chronic cough  76 yowm  never smoker with atrial arrhythmias, hypertension, migraine who presents for chronic cough.  He has had chronic cough for over a year and has been treated with multiple rounds of antibiotics (at least 5) without improvement. He initially had symptoms of bronchitis. His cough is mainly dry and hacking in character and nonproductive and associated with sore throat. He feels an internal irritation that stimulates his cough. He noticed after laughing he would have severe coughing fits. In the last few months cough will occur without laughing. When lying down, cough will worsen. It interferes with his sleep. Has tried over the counter medications and tessalon perles. Triggered by cold air, activity, bending over, laying over. Symptoms only improve with time. Reports allergies all year-round and taking claritin. Has post nasal drip. Reports reflux which is well-controlled on omeprazole daily. Denies chest tightness and wheezing.  Hx of DVT/PE in mom and aunts.  Reviewed Cardiology 02/17/17: Followed for palpitations and started on Flecainide. 48Hr Holter 06/10/17: Focal atrial tachycardia rec Chronic Cough -STOP Lisinopril x 1 month -We will need to schedule Pulmonary Function Test. Please contact our office for the best date for your work schedule -Cough syrup as needed -Zofran as needed for nausea related to cough syrup Allergies -Take Flonase 1 spray per nare daily       Date of Admission: 06/04/2018                     Discharge: 06/07/2018      Indication for Hospitalization: Community-acquired pneumonia with failed outpatient treatment  Discharge Diagnoses/Problem List:  Community-acquired pneumonia Heart failure ruled out CKD Hypertension Sinus tachycardia Chronic  cough Depression Migraines Insomnia GERD    Brief Hospital Course:   Community-acquired pneumonia He presented to the ED with cough and shortness of breath for 10 days and recent flu positive diagnosis.  He was admitted and initially treated with ceftriaxone ceftriaxone (3/19) and azithromycin (3/19) which were quickly broadened to vancomycin (3/20-3/21) and cefepime (3/20-3/22).  Over the next 2 days, he should significant improvement in his respiratory status and a decrease in his subjective "chest congestion".  He was discharged home with a 5-day course of azithromycin (3/21-3/25).  His last day of treatment is 3/25.  Congestive heart failure ruled out There is initial concern for fluid overload and heart failure due to his presentation with shortness of breath and crackles with lower extremity edema.  IV Lasix 20 mg was given day of admission significant diuresis.  Echocardiogram on 3/20 showed no evidence of systolic, diastolic left-sided or right-sided heart failure and demonstrated a compressible IVC.  His lower extremity edema showed improvement before discharge.  There is no evidence to strongly support that he suffers from congestive heart failure at this time.  No medications for heart failure were started while in hospital. D/c meds TAKE these medications   azithromycin 250 MG tablet Commonly known as:  ZITHROMAX Take 1 tablet (250 mg total) by mouth daily for 3 days.   buPROPion 150 MG 24 hr tablet Commonly known as:  WELLBUTRIN XL Take 150 mg by mouth daily.   gabapentin 300 MG capsule Commonly known as:  NEURONTIN Take 300 mg by mouth daily.   HYDROcodone-homatropine 5-1.5 MG/5ML syrup Commonly known as:  HYCODAN Take 5  mLs by mouth every 6 (six) hours as needed for cough. What changed:  Another medication with the same name was added. Make sure you understand how and when to take each.   HYDROcodone-homatropine 5-1.5 MG/5ML syrup Commonly known as:   HYCODAN Take 5 mLs by mouth every 6 (six) hours as needed for cough. What changed:  You were already taking a medication with the same name, and this prescription was added. Make sure you understand how and when to take each.   Loratadine 10 MG Caps Take 10 mg by mouth daily.   Melatonin 5 MG Tabs Take 5 mg by mouth at bedtime.   meloxicam 7.5 MG tablet Commonly known as:  MOBIC Take 7.5 mg by mouth 2 (two) times daily.   methocarbamol 500 MG tablet Commonly known as:  ROBAXIN Take 500 mg by mouth daily as needed for muscle spasms.   multivitamin tablet Take 1 tablet by mouth daily.   omeprazole 40 MG capsule Commonly known as:  PRILOSEC Take 40 mg by mouth daily.   ondansetron 4 MG tablet Commonly known as:  Zofran Take 1 tablet (4 mg total) by mouth every 8 (eight) hours as needed for nausea or vomiting.   REGLAN PO Take 1 tablet by mouth daily as needed (nausea/vomiting).   Spiriva Respimat 1.25 MCG/ACT Aers Generic drug:  Tiotropium Bromide Monohydrate Inhale 2 puffs into the lungs daily.   verapamil 180 MG CR tablet Commonly known as:  CALAN-SR Take 180 mg by mouth daily.       06/10/2018 Televisit/ [name]  Patient was scheduled for a tele-visit today for a hospital follow-up.  He was admitted to the hospital on 06/04/2018 with community-acquired pneumonia after being diagnosed with influenza.  He was treated with vancomycin, cefepime, ceftriaxone, and azithromycin at discharge.  He also had lower extremity edema and was initially treated with IV Lasix but echo on 3/20 showed no evidence of systolic or diastolic left-sided or right-sided heart failure.  The ED doctor noted that there was no evidence to strongly support a diagnosis of congestive heart failure at that time.    Patient states since discharge his symptoms have worsened.  He states that his chest congestion and cough have worsened and lower extremity edema has returned.  He went to urgent  care yesterday (06/10/18) and states that chest x-ray showed worsening pneumonia.  He was prescribed Levaquin and prednisone taper as well as Lasix 20 mg daily for peripheral edema.  He states that he is much improved today and is afebrile today. Denies f/c/s, n/v/d, hemoptysis, PND, leg swelling.   Observations/Objective: CT chest 06/06/18 - Mild bilateral patchy nodular airspace process likely representing atypical infectious or inflammatory process. Minimal mediastinal adenopathy likely reactive. Recommend follow-up CT 4 weeks. 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 Recommend annual imaging followup by CTA or MRA.  CXR 06/04/18 - Cardiomegaly with vascular congestion. Patchy interstitial and alveolar infiltrate at the left base  Assessment and Plan: Patient states since hospital discharge his symptoms have worsened.  He states that his chest congestion and cough have worsened and lower extremity edema.  He went to urgent care yesterday and states that chest x-ray showed worsening pneumonia.  He was prescribed Levaquin and prednisone taper as well as Lasix 20 mg daily for peripheral edema.  He states that he is much improved today and is afebrile today. Will recommend continuing treatment by urgent care with close follow up with Dr. Melvyn Novas this week.  Patient Instructions  Continue Levaquin Continue prednisone Continue lasix Keep legs elevated as much as possible Low sodium diet   Follow Up Instructions:  Follow up with Dr. Melvyn Novas in 2 days If symptoms worsen please go to the ED    06/12/2018  Extended  ov/Yuridiana Formanek re: consultation/ second opinion re chronic cough/ acutely worse since mid feb 2020 in setting of documented influenza with neg COVID-19 testing Chief Complaint  Patient presents with  . Consult  Never smoker [place]  [place] with sneezing/runny/nose/cough as far back as he remember eval by allergist in his 76's by [facility] allergy with pos  mold/grass improved  with zyrtec but still had nasal drainage mostly daytime changed to clariton when zyrtec quit working which helped / flonase made drainage while on acei x decades stopped last pill 05/15/18 at Dr Cordelia Pen rec and no better on cough syrup then developed fever worse cough aches 05/26/2018 pos Influenza > Tamiflu    Started rx 05/28/18 > back  ER 05/30/18 > overnight rx doxy/ zmax Levaquin > fever resolved / aches resolved but cough and breathing worse so admitted as above with dx of pna.  On way home from above admit started back with bad cough/ fever > UC 06/09/18 and started back on levaquin / prednisone.    Dyspnea:  Ok if not coughing Cough: non productive, still has sensation of pnds/ cough on insp/ better with "short acting tussionex" Sleeping: last able to sleep flat 3 weeks prior to OV  p clonapin and melatonin and ambien SABA use: no better on spiriva  rec  Finish your levaquin,  Stop cough syrup, spiriva and zyrtec  Continue furosemide 20 mg daily  Leave off gabapentin 300 mg for a week to see what happens with the swelling first  First take delsym two tsp every 12 hours and supplement if needed with  hyrdocone 10 mg  every 4 hours to suppress the urge to cough at all or even clear your throat.  Once you have eliminated the cough for 3 straight days try reducing the hydrocodone first,  then the delsym as tolerated.   When you finish the Prednisone 50 start  10 mg take  4 each am x 2 days,   2 each am x 2 days,  1 each am x 2 days and stop (this is to eliminate allergies and inflammation from coughing) Omeprazole 40 mg Take 30- 60 min before your first and last meals of the day  For drainage / throat tickle try take CHLORPHENIRAMINE  4 mg - take one every 4 hours as needed - available over the counter- may cause drowsiness so start with just a bedtime dose or two and see how you tolerate it before trying in daytime   GERD diet    07/06/2018 Increase chlorpheniramine  to 4 mg x 2 at bedtime  Hydrocodone 10 mg at bedtime if needed  GERD diet  >>> Needs f/u ov in 2 weeks with cxr , esr, cbc with diff/ allergy profile - bring all meds    07/13/2018  f/u ov/Moriah Loughry re: uacs / did not bring meds as requested Chief Complaint  Patient presents with  . Follow-up    Cough has improved some but not completely resolved.    Dyspnea:  Not limited by breathing from desired activities  /steps ok Cough: some sense of pnds not as bad, using clariton helps   Sleeping: resp wise fine always after chlorpheniraamine and hydrocodonone  x  20 mg Ok flat > recliner for back sometimes  SABA use: none 02: none rec Singulair 10 mg every pm and stop zyrtec and continue clariton daily  Increase gabapentin 346m twice daily  For drainage / throat tickle try take CHLORPHENIRAMINE  4 mg  For leg swelling > take furosemide 20 mg daily   Please schedule a follow up office visit in 4 weeks, sooner if needed  with all medications /inhalers/ solutions in hand so we can verify exactly what you are taking. This includes all medications from all doctors and over the counters > did not do   07/27/2020 acute extended  ov/Taunya Goral re: pnds / cough  Chief Complaint  Patient presents with  . Acute Visit    Increased SOB over the past month. He states he esp gets SOB at night. He has been noticing some swelling and weight gain since 2020 when he was started on gabapentin.   Dyspnea: 50 ft  Cough: mainly throat clearing with sensation of pnds on 4 different antihistamines at at time, none 1st generation as prev rec "couldn't fine them" Sleeping: wakes up gasping for air / flat bed  SABA use: none  02: none  Covid status:   vax x 2 last year    No obvious day to day or daytime variability or assoc excess/ purulent sputum or mucus plugs or hemoptysis or cp or chest tightness, subjective wheeze or overt sinus or hb symptoms.     Also denies any obvious fluctuation of symptoms with weather or  environmental changes or other aggravating or alleviating factors except as outlined above   No unusual exposure hx or h/o childhood pna/ asthma or knowledge of premature birth.  Current Allergies, Complete Past Medical History, Past Surgical History, Family History, and Social History were reviewed in [employer].  ROS  The following are not active complaints unless bolded Hoarseness, sore throat, dysphagia, dental problems, itching, sneezing,  nasal congestion or discharge of excess mucus or purulent secretions, ear ache,   fever, chills, sweats, unintended wt loss or wt gain, classically pleuritic or exertional cp,  orthopnea pnd or arm/hand swelling  or leg swelling chronic L > R  presyncope, palpitations, abdominal pain, anorexia, nausea, vomiting, diarrhea  or change in bowel habits or change in bladder habits, change in stools or change in urine, dysuria, hematuria,  rash, arthralgias, visual complaints, headache, numbness, weakness or ataxia or problems with walking or coordination,  change in mood or  memory.        Current Meds  Medication Sig  . atenolol-chlorthalidone (TENORETIC) 100-25 MG tablet Take 1 tablet by mouth daily.  . Biotin 2500 MCG CAPS Take 1 capsule by mouth daily.  . cetirizine (ZYRTEC) 10 MG tablet Take 10 mg by mouth daily.  .Marland Kitchendicyclomine (BENTYL) 10 MG capsule Take 1 capsule (10 mg total) by mouth in the morning and at bedtime.  . fexofenadine (ALLEGRA) 180 MG tablet Take 180 mg by mouth daily.  . furosemide (LASIX) 40 MG tablet Take 40 mg by mouth 2 (two) times daily.  .Marland Kitchengabapentin (NEURONTIN) 600 MG tablet Take 600 mg by mouth 2 (two) times daily.  .Marland Kitchenlevocetirizine (XYZAL) 5 MG tablet Take 5 mg by mouth every evening.  . loratadine (CLARITIN) 10 MG tablet Take 10 mg by mouth daily.  .Marland Kitchenlosartan-hydrochlorothiazide (HYZAAR) 50-12.5 MG tablet Take 1 tablet by mouth 2 (two) times daily.  . Melatonin 5 MG CHEW Chew 5 mg by mouth at  bedtime. 2 tablets at bedtime  . meloxicam (MOBIC) 7.5 MG tablet Take 7.5 mg by mouth 2 (two) times daily.   . Multiple Vitamin (MULTIVITAMIN) tablet Take 1 tablet by mouth daily.  . pantoprazole (PROTONIX) 40 MG tablet Take 1 tablet by mouth 2 (two) times daily.  Marland Kitchen POTASSIUM PO Take 1 tablet by mouth at bedtime.  . pramipexole (MIRAPEX) 0.5 MG tablet Take 0.5 mg by mouth at bedtime.  . temazepam (RESTORIL) 15 MG capsule Take 15 mg by mouth at bedtime.  . verapamil (VERELAN PM) 180 MG 24 hr capsule Take 180 mg by mouth daily.                        Objective:      07/27/2020       294  07/13/2018       232   06/12/18 235 lb (106.6 kg)  06/06/18 238 lb 1.6 oz (108 kg)  05/15/18 243 lb 6.4 oz (110.4 kg)     Vital signs reviewed  07/27/2020  - Note at rest 02 sats  95% on RA   General appearance:    Obese wm somewhat aggravated by refractory pnds/ wt gain    HEENT : pt wearing mask not removed for exam due to covid -19 concerns.    NECK :  without JVD/Nodes/TM/ nl carotid upstrokes bilaterally   LUNGS: no acc muscle use,  Nl contour chest which is clear to A and P bilaterally without cough on insp or exp maneuvers   CV:  RRR  no s3 or murmur or increase in P2, and mild pitting LE L >R  edema   ABD:  soft and nontender with nl inspiratory excursion in the supine position. No bruits or organomegaly appreciated, bowel sounds nl  MS:  Nl gait/ ext warm without deformities, calf tenderness, cyanosis or clubbing No obvious joint restrictions   SKIN: warm and dry without lesions    NEURO:  alert, approp, nl sensorium with  no motor or cerebellar deficits apparent.        CXR PA and Lateral:   07/27/2020 :    I personally reviewed images and agree with radiology impression as follows:   No active cardiopulmonary disease.           Assessment & Plan:

## 2020-07-27 NOTE — Assessment & Plan Note (Signed)
Onset ? Childhood/ worse since covid-like infection 05/2018 with neg covid testing   - Allergy eval by [facility] pos mold/grass > never resolved pnds - 05/15/2018 ACEi d/c by Mechele Collin, MD - 07/06/2018 responded on to oxycodone, worsening off it  - Allergy profile 07/13/2018 >  Eos 0.3 /  IgE  4 RAST pos grass only - Singulair 10 mg 07/13/2018 >>> - gabapentin 300 mg bid resarted for pnds plus prn 1st gen H1 blockers per guidelines  > improved  - 07/27/2020   restarted 1st gen H1 blockers per guidelines  Plus rec bed blocks plus allergy re-eval in [place]/[facility]   Upper airway cough syndrome (previously labeled PNDS),  is so named because it's frequently impossible to sort out how much is  CR/sinusitis with freq throat clearing (which can be related to primary GERD)   vs  causing  secondary (" extra esophageal")  GERD from wide swings in gastric pressure that occur with throat clearing, often  promoting self use of mint and menthol lozenges that reduce the lower esophageal sphincter tone and exacerbate the problem further in a cyclical fashion.   These are the same pts (now being labeled as having "irritable larynx syndrome" by some cough centers) who not infrequently have a history of having failed to tolerate ace inhibitors,  dry powder inhalers or biphosphonates or report having atypical/extraesophageal reflux symptoms that don't respond to standard doses of PPI  and are easily confused as having aecopd or asthma flares by even experienced allergists/ pulmonologists (myself included).   Of the three most common causes of  Sub-acute / recurrent or chronic cough, only one (GERD)  can actually contribute to/ trigger  the other two (asthma and post nasal drip syndrome)  and perpetuate the cylce of cough.  While not intuitively obvious, many patients with chronic low grade reflux do not cough until there is a primary insult that disturbs the protective epithelial barrier and exposes sensitive nerve  endings.   This is typically viral but can due to PNDS and  either may apply here.   The point is that once this occurs, it is difficult to eliminate the cycle  using anything but a maximally effective acid suppression regimen at least in the short run, accompanied by an appropriate diet to address non acid GERD and control / eliminate the pnds with 1st gen H1 blockers per guidelines  And refer back to allergy for 2nd opinion and if not effective > Delford Field at [facility] center

## 2020-07-27 NOTE — Patient Instructions (Addendum)
For drainage / throat tickle try take CHLORPHENIRAMINE  4 mg  (Chlortab 4mg   at should be easiest to find in the green box)  take one every 4 hours as needed - available over the counter- may cause drowsiness so start with just a dose or two an hour before bedtime and see how you tolerate it before trying in daytime     Stop your other allergy medications   You may need an alternative to atenolol which in high doses can make you wheeze.    I will be referring you to allergy In  ([facility])  to eval your nasal drainage and if not helpful let me refer to Dr [employer] at [facility] / ENT [facility].    I will send you to one of our sleep medicine doctors next available.   Please remember to go to the  x-ray department  for your tests - we will call you with the results when they are available    Pulmonary follow up is not needed.

## 2020-07-28 ENCOUNTER — Encounter: Payer: Self-pay | Admitting: Neurology

## 2020-07-28 ENCOUNTER — Ambulatory Visit: Payer: 59 | Admitting: Neurology

## 2020-07-28 VITALS — BP 101/67 | HR 70 | Ht 72.0 in | Wt 296.6 lb

## 2020-07-28 DIAGNOSIS — G2581 Restless legs syndrome: Secondary | ICD-10-CM

## 2020-07-28 DIAGNOSIS — G4710 Hypersomnia, unspecified: Secondary | ICD-10-CM | POA: Diagnosis not present

## 2020-07-28 DIAGNOSIS — R269 Unspecified abnormalities of gait and mobility: Secondary | ICD-10-CM

## 2020-07-28 DIAGNOSIS — G6289 Other specified polyneuropathies: Secondary | ICD-10-CM | POA: Diagnosis not present

## 2020-07-28 DIAGNOSIS — G629 Polyneuropathy, unspecified: Secondary | ICD-10-CM | POA: Insufficient documentation

## 2020-07-28 MED ORDER — DULOXETINE HCL 60 MG PO CPEP: 60.0000 mg | ORAL_CAPSULE | Freq: Every day | ORAL | 11 refills | Status: DC

## 2020-07-28 NOTE — Progress Notes (Signed)
Chief Complaint  Patient presents with  . New Patient (Initial Visit)    RM 16, alone. Paper referral from [facility] Lee,MD for peripheral neuropathy, idiopathic.   Marland Kitchen PCP    Marquette Saa Lee,MD      ASSESSMENT AND PLAN  Jack Lawson is a 55 y.o. male   Progressive gait abnormality Bilateral lower extremity paresthesia, neuropathic pain Restless leg syndrome Morbid obesity  On examination, he has morbid obesity, length dependent sensory changes, loss of distal hairs, toes discoloration, brisk reflex of bilateral patella, probable bilateral Babinski signs, wide-based stiff, unsteady gait  Differentiation diagnosis include cervical myelopathy, with superimposed peripheral neuropathy  Bring laboratory evaluation next follow-up visit  EMG nerve conduction study  MRI of cervical spine  He complains of excessive weight gain, sweating with gabapentin use, will stop gabapentin, start Cymbalta 60 mg daily  Continue Mirapex 0.5 mg along with melatonin for sleep  Possible obstructive sleep apnea  Excessive weight gain, frequent awakening at nighttime, gasping for air, excessive fatigue, sleepiness during the day, narrow oropharyngeal space  Referred for sleep study    DIAGNOSTIC DATA (LABS, IMAGING, TESTING) - I reviewed patient records, labs, notes, testing and imaging myself where available.   HISTORICAL  Jack Lawson, is of 55 year old male, seen in request by his primary care physician Dr. Nedra Hai, Marquette Saa, for evaluation of gait abnormality, lower extremity paresthesia, initial evaluation was on Jul 28, 2020.  I reviewed and summarized the referring note.  Past medical history Hypertension Restless leg syndrome Obesity, Depression  Patient reported gradual decline in functional status since 2020, starting from rapid weight gain, then noticed intermittent shooting pain at toes, gradually getting worse, more frequent, more severe, was diagnosed with peripheral neuropathy, started on  gabapentin, which has helped his lower extremity symptoms, but he reported more rapid weight gain, lower extremity swelling,  He began to notice gradual onset mildly unsteady gait, has worked out 6 months persistently for a while, did not improve his stamina, he complains of excessive fatigue, sleepiness during the day, also complains of frequent awakening at nighttime, gasping for air, was recently referred by primary care to ENT for possible acid reflux  He was started on Mirapex 0.5 mg at bedtime for restless leg symptoms, really has helped his sleeping, along with melatonin  He denies bowel and bladder incontinence,  PHYSICAL EXAM:   Vitals:   07/28/20 1011  BP: 101/67  Pulse: 70  Weight: 296 lb 9.6 oz (134.5 kg)  Height: 6' (1.829 m)   Not recorded     Body mass index is 40.23 kg/m.  PHYSICAL EXAMNIATION:  Gen: NAD, conversant, well nourised, well groomed                     Cardiovascular: Regular rate rhythm, no peripheral edema, warm, nontender. Eyes: Conjunctivae clear without exudates or hemorrhage Neck: Supple, no carotid bruits. Pulmonary: Clear to auscultation bilaterally   NEUROLOGICAL EXAM:  MENTAL STATUS: Morbid obesity, central obesity, shortness of breath with minimum exertion, Speech:    Speech is normal; fluent and spontaneous with normal comprehension.  Cognition:     Orientation to time, place and person     Normal recent and remote memory     Normal Attention span and concentration     Normal Language, naming, repeating,spontaneous speech     Fund of knowledge   CRANIAL NERVES: CN II: Visual fields are full to confrontation. Pupils are round equal and briskly reactive to light. CN III,  IV, VI: extraocular movement are normal. No ptosis. CN V: Facial sensation is intact to light touch CN VII: Face is symmetric with normal eye closure  CN VIII: Hearing is normal to causal conversation. CN IX, X: Phonation is normal. CN XI: Head turning and  shoulder shrug are intact CN XII: Narrow oropharyngeal  MOTOR: There is no pronator drift of out-stretched arms. Muscle bulk and tone are normal. Muscle strength is normal.  REFLEXES: Reflexes are 2+ and symmetric at the biceps, triceps, 3/3 at knees, and absent at ankles. Plantar responses are extensor bilaterally  SENSORY: Length dependent decreased to light touch, pinprick and vibratory sensation to ankle level, there was loss of hair to distal shin level, bilateral toes discoloration  COORDINATION: There is no trunk or limb dysmetria noted.  GAIT/STANCE: He can get up from seated position arm crossed, limited by his big body habitus, stiff, wide-based, cautious, mildly unsteady, difficulty standing up on tiptoe, heels, mild positive Romberg sign  REVIEW OF SYSTEMS:  Full 14 system review of systems performed and notable only for as above All other review of systems were negative.   ALLERGIES: Allergies  Allergen Reactions  . Codeine Nausea And Vomiting    HOME MEDICATIONS: Current Outpatient Medications  Medication Sig Dispense Refill  . atenolol-chlorthalidone (TENORETIC) 100-25 MG tablet Take 1 tablet by mouth daily.    . Biotin 2500 MCG CAPS Take 1 capsule by mouth daily.    . furosemide (LASIX) 40 MG tablet Take 40 mg by mouth 2 (two) times daily.    Marland Kitchen gabapentin (NEURONTIN) 600 MG tablet Take 600 mg by mouth 2 (two) times daily.    Marland Kitchen losartan-hydrochlorothiazide (HYZAAR) 50-12.5 MG tablet Take 1 tablet by mouth 2 (two) times daily.    . Melatonin 5 MG CHEW Chew 5 mg by mouth at bedtime. 2 tablets at bedtime    . meloxicam (MOBIC) 7.5 MG tablet Take 7.5 mg by mouth 2 (two) times daily.     . Multiple Vitamin (MULTIVITAMIN) tablet Take 1 tablet by mouth daily.    . pantoprazole (PROTONIX) 40 MG tablet Take 1 tablet by mouth 2 (two) times daily.    Marland Kitchen POTASSIUM PO Take 1 tablet by mouth at bedtime.    . pramipexole (MIRAPEX) 0.5 MG tablet Take 0.5 mg by mouth at  bedtime.    . temazepam (RESTORIL) 15 MG capsule Take 15 mg by mouth at bedtime.    . verapamil (VERELAN PM) 180 MG 24 hr capsule Take 180 mg by mouth daily.     No current facility-administered medications for this visit.    PAST MEDICAL HISTORY: Past Medical History:  Diagnosis Date  . ADD (attention deficit disorder)   . Atrial arrhythmia   . Back pain   . Depression   . Elevated liver enzymes   . Esophageal reflux   . Family history of colon cancer   . Fibromyalgia   . GERD (gastroesophageal reflux disease)   . HTN (hypertension)   . Hyperlipidemia   . Hypogonadism in male   . IBS (irritable bowel syndrome)   . Insomnia   . Migraine headache   . Osteoarthrosis   . Palpitation   . Peripheral neuropathy, idiopathic   . PSVT (paroxysmal supraventricular tachycardia) (HCC)   . RLS (restless legs syndrome)     PAST SURGICAL HISTORY: Past Surgical History:  Procedure Laterality Date  . COLONOSCOPY  08/10/2009   Mild sigmoid diverticulosis. Otherwise normal colonoscopy to terminal ileum  . COLONOSCOPY  04/24/2017   Colonic polyp status post polypectomy. Moderate predominantly sigmoid diverticulosis.   Marland Kitchen LIPOMA RESECTION      FAMILY HISTORY: Family History  Problem Relation Age of Onset  . Deep vein thrombosis Mother   . Pulmonary embolism Mother   . Hypertension Mother   . CVA Mother   . Deep vein thrombosis Maternal Aunt   . Colon cancer Maternal Aunt   . Breast cancer Paternal Aunt   . Colon polyps Father     SOCIAL HISTORY: Social History   Socioeconomic History  . Marital status: Single    Spouse name: Not on file  . Number of children: 0  . Years of education: 39  . Highest education level: Not on file  Occupational History  . Occupation: Engineer, civil (consulting)  Tobacco Use  . Smoking status: Never Smoker  . Smokeless tobacco: Never Used  Vaping Use  . Vaping Use: Never used  Substance and Sexual Activity  . Alcohol use: Yes    Comment: rare   . Drug use: Never  . Sexual activity: Not on file  Other Topics Concern  . Not on file  Social History Narrative   Lives w/ roommate   Caffeine use: none   Right handed    Social Determinants of Health   Financial Resource Strain: Not on file  Food Insecurity: Not on file  Transportation Needs: Not on file  Physical Activity: Not on file  Stress: Not on file  Social Connections: Not on file  Intimate Partner Violence: Not on file      Levert Feinstein, M.D. Ph.D.  [facility] [address] [place], [place] [address] Ph: [phone] Fax: [phone]  CC:  [name], MD [address] [place],  [place] [address]  [name], MD

## 2020-07-30 ENCOUNTER — Encounter: Payer: Self-pay | Admitting: Internal Medicine

## 2020-07-31 ENCOUNTER — Encounter: Payer: Self-pay | Admitting: *Deleted

## 2020-07-31 ENCOUNTER — Ambulatory Visit (HOSPITAL_BASED_OUTPATIENT_CLINIC_OR_DEPARTMENT_OTHER)
Admission: RE | Admit: 2020-07-31 | Discharge: 2020-07-31 | Disposition: A | Discharge status: Home / Self Care | Payer: 59 | Source: Ambulatory Visit | Attending: Gastroenterology | Admitting: Gastroenterology

## 2020-07-31 ENCOUNTER — Other Ambulatory Visit: Payer: Self-pay

## 2020-07-31 DIAGNOSIS — R945 Abnormal results of liver function studies: Secondary | ICD-10-CM | POA: Insufficient documentation

## 2020-07-31 DIAGNOSIS — Z86010 Personal history of colonic polyps: Secondary | ICD-10-CM | POA: Diagnosis present

## 2020-07-31 DIAGNOSIS — R7989 Other specified abnormal findings of blood chemistry: Secondary | ICD-10-CM

## 2020-07-31 DIAGNOSIS — K582 Mixed irritable bowel syndrome: Secondary | ICD-10-CM | POA: Diagnosis present

## 2020-07-31 DIAGNOSIS — K219 Gastro-esophageal reflux disease without esophagitis: Secondary | ICD-10-CM | POA: Insufficient documentation

## 2020-08-03 ENCOUNTER — Telehealth: Payer: Self-pay | Admitting: Neurology

## 2020-08-03 NOTE — Telephone Encounter (Signed)
Began authorization for MRI Cervical Spine wo contrast. Case is placed on hold pending medical review. I faxed notes to [facility] @ [phone].

## 2020-08-07 NOTE — Telephone Encounter (Signed)
Received approval for MRI. Auth: [id number] (08/07/20- 09/21/20).

## 2020-08-07 NOTE — Telephone Encounter (Signed)
Patient said he is going to talk with his insurance company about the price and call us back to schedule

## 2020-08-07 NOTE — Telephone Encounter (Signed)
LMV for patient to call back to schedule MRI

## 2020-09-11 ENCOUNTER — Encounter: Payer: 59 | Admitting: Neurology

## 2021-01-21 IMAGING — DX CHEST - 2 VIEW
2 series · 2 of 2 positions shown · non-contrast
Comparison: Single-view of the chest 06/04/2018. PA and lateral
chest 02/20/2017. CT chest 06/06/2018.

CLINICAL DATA: Cough.

EXAM:
CHEST - 2 VIEW

[chest pa]
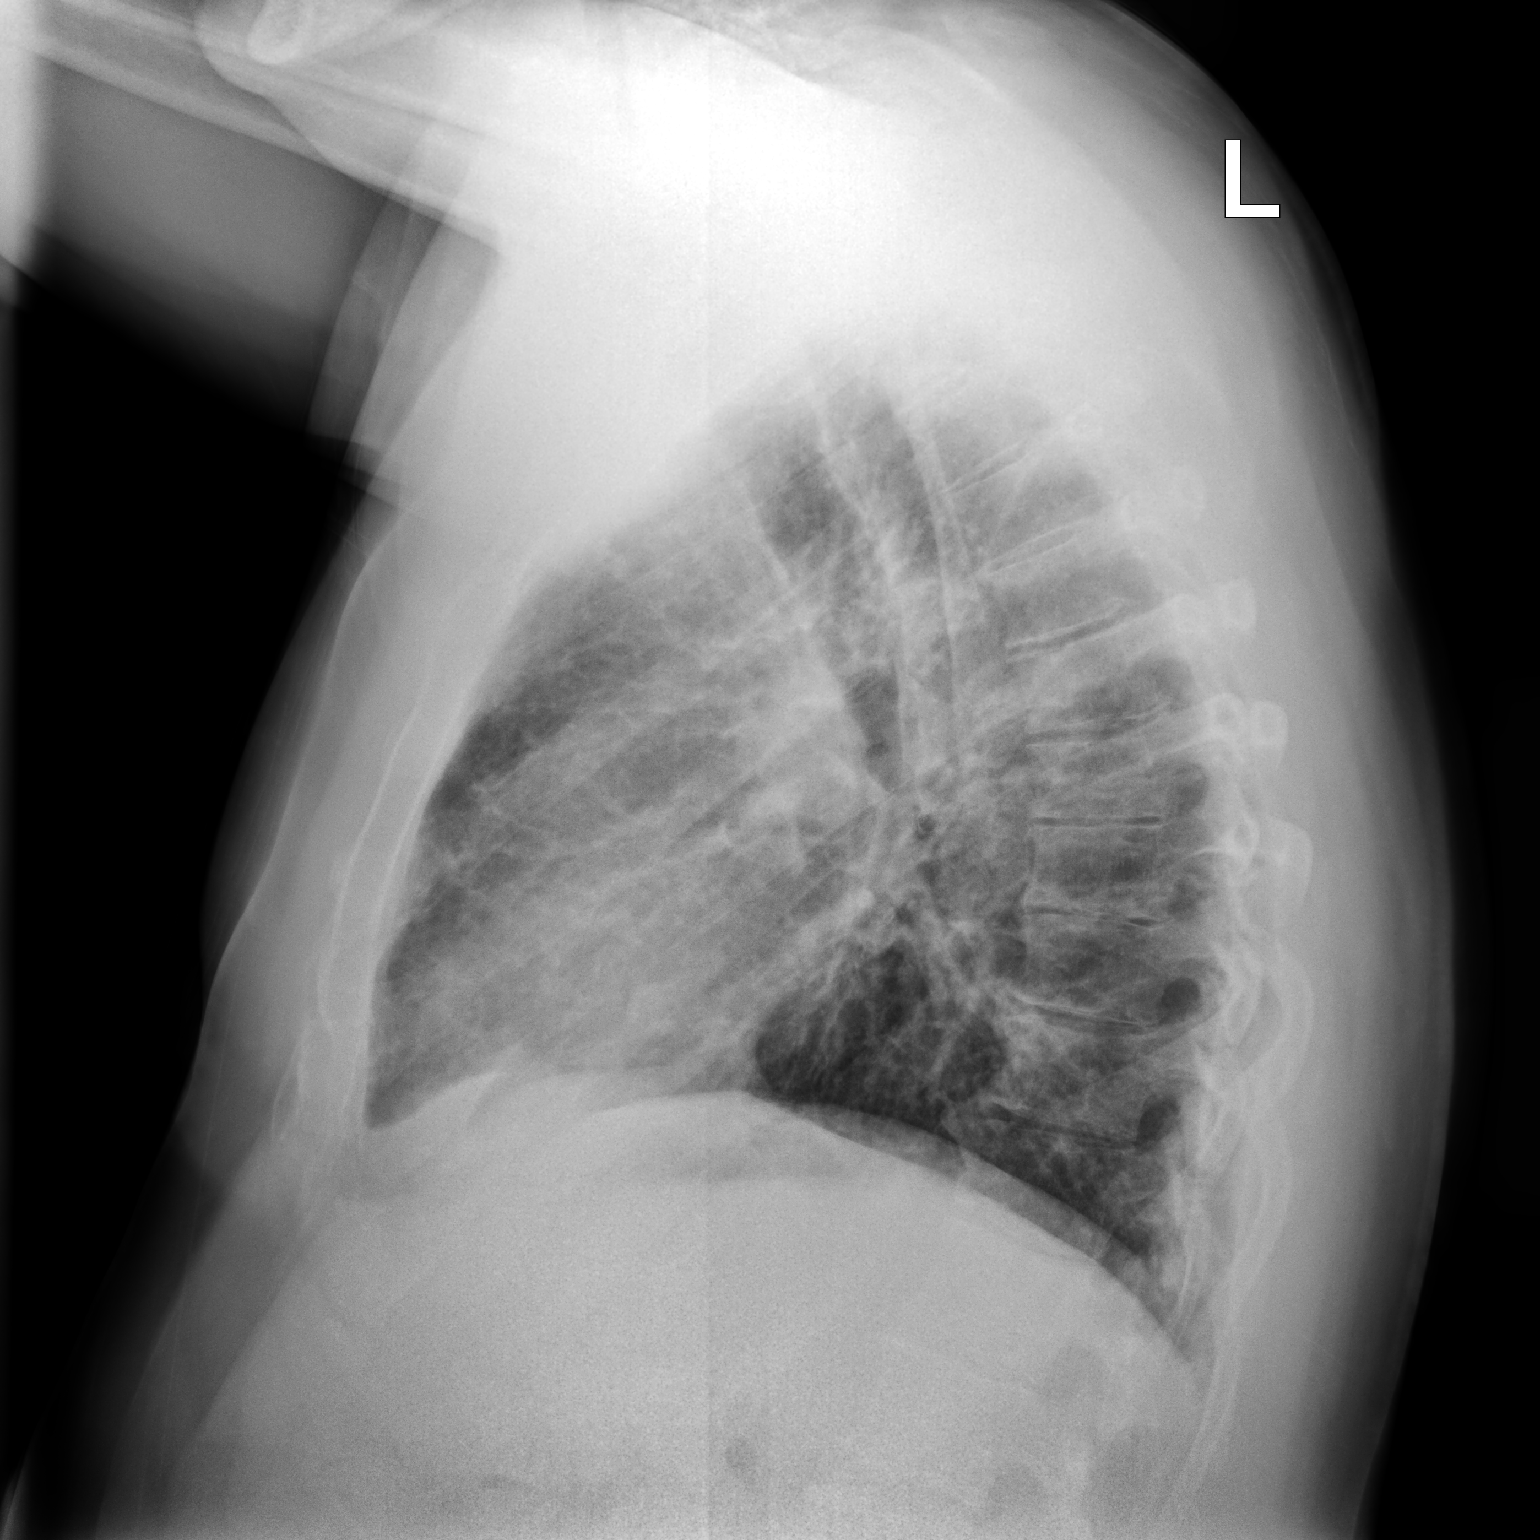

[chest lat]
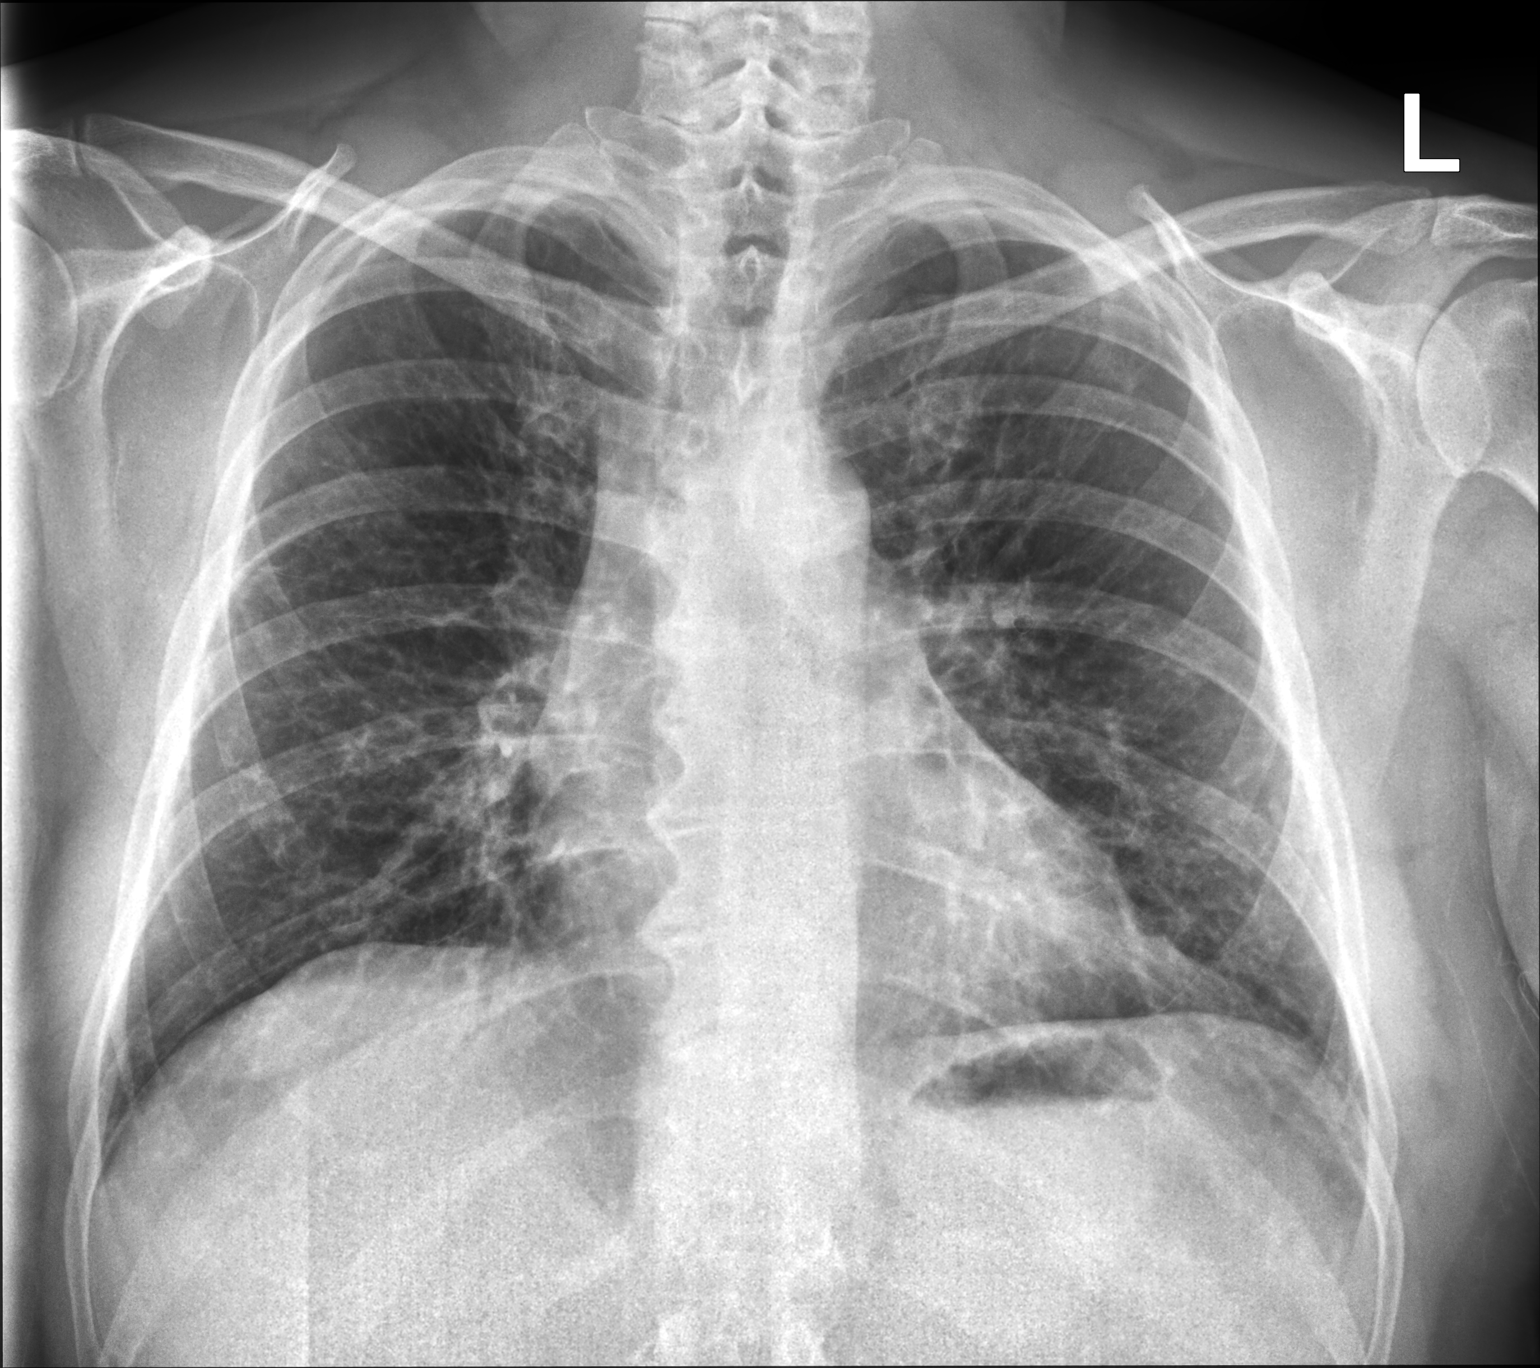

[2 of 2 positions shown; findings below may reference images not displayed]

FINDINGS: Patchy bilateral nodular opacities are again seen as on the 2 most
recent examinations. The chest is better expanded on today's study
compared to the most recent plain film of the chest with decreased
basilar atelectasis. Heart size is normal. No pneumothorax or
pleural effusion.
IMPRESSION: No marked change in reticulonodular opacities since the most recent
examination most compatible with infectious or inflammatory process.

## 2021-02-21 IMAGING — DX CHEST - 2 VIEW
2 series · 2 of 2 positions shown · non-contrast
Comparison: 06/12/2018

CLINICAL DATA: Cough

EXAM:
CHEST - 2 VIEW

[chest pa]
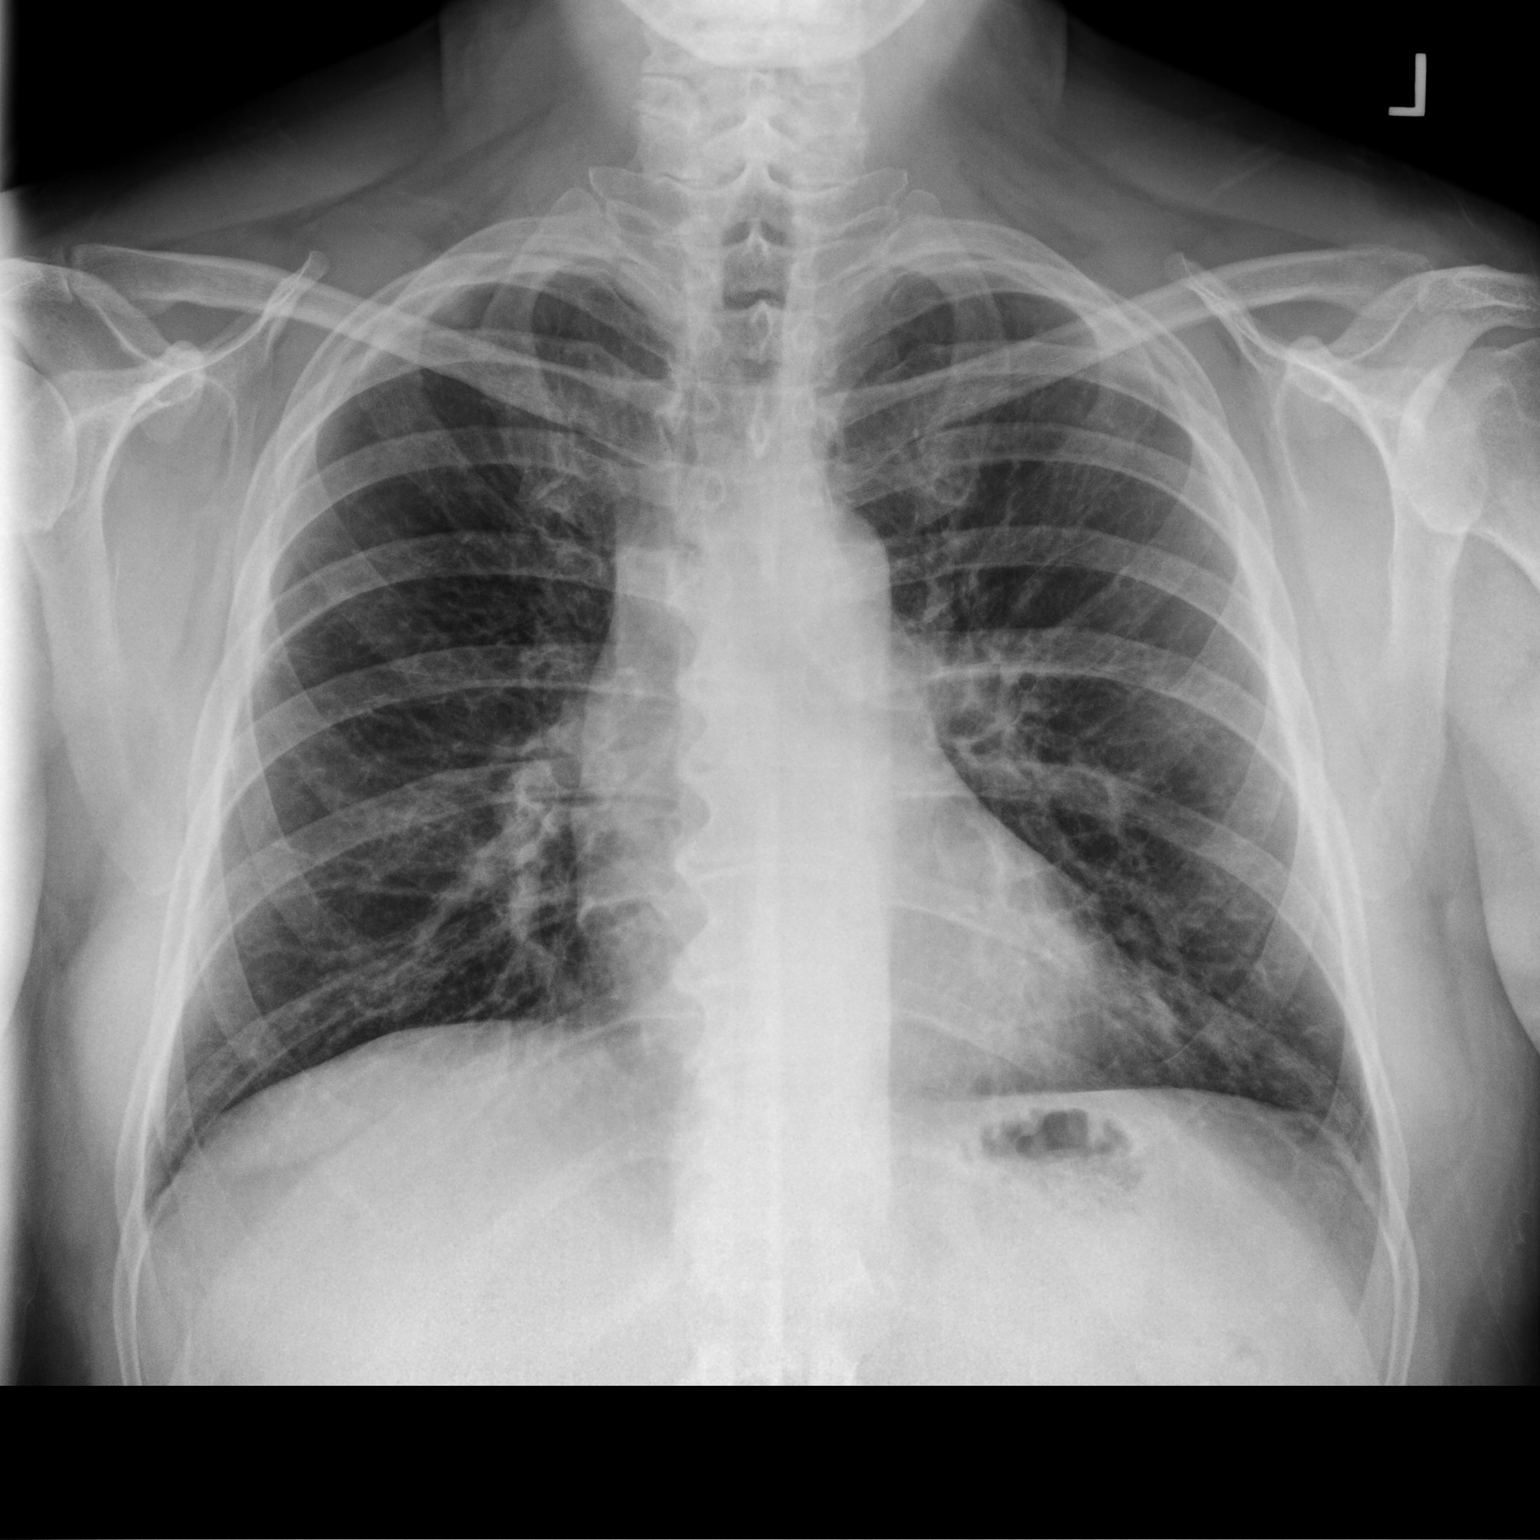

[chest lat]
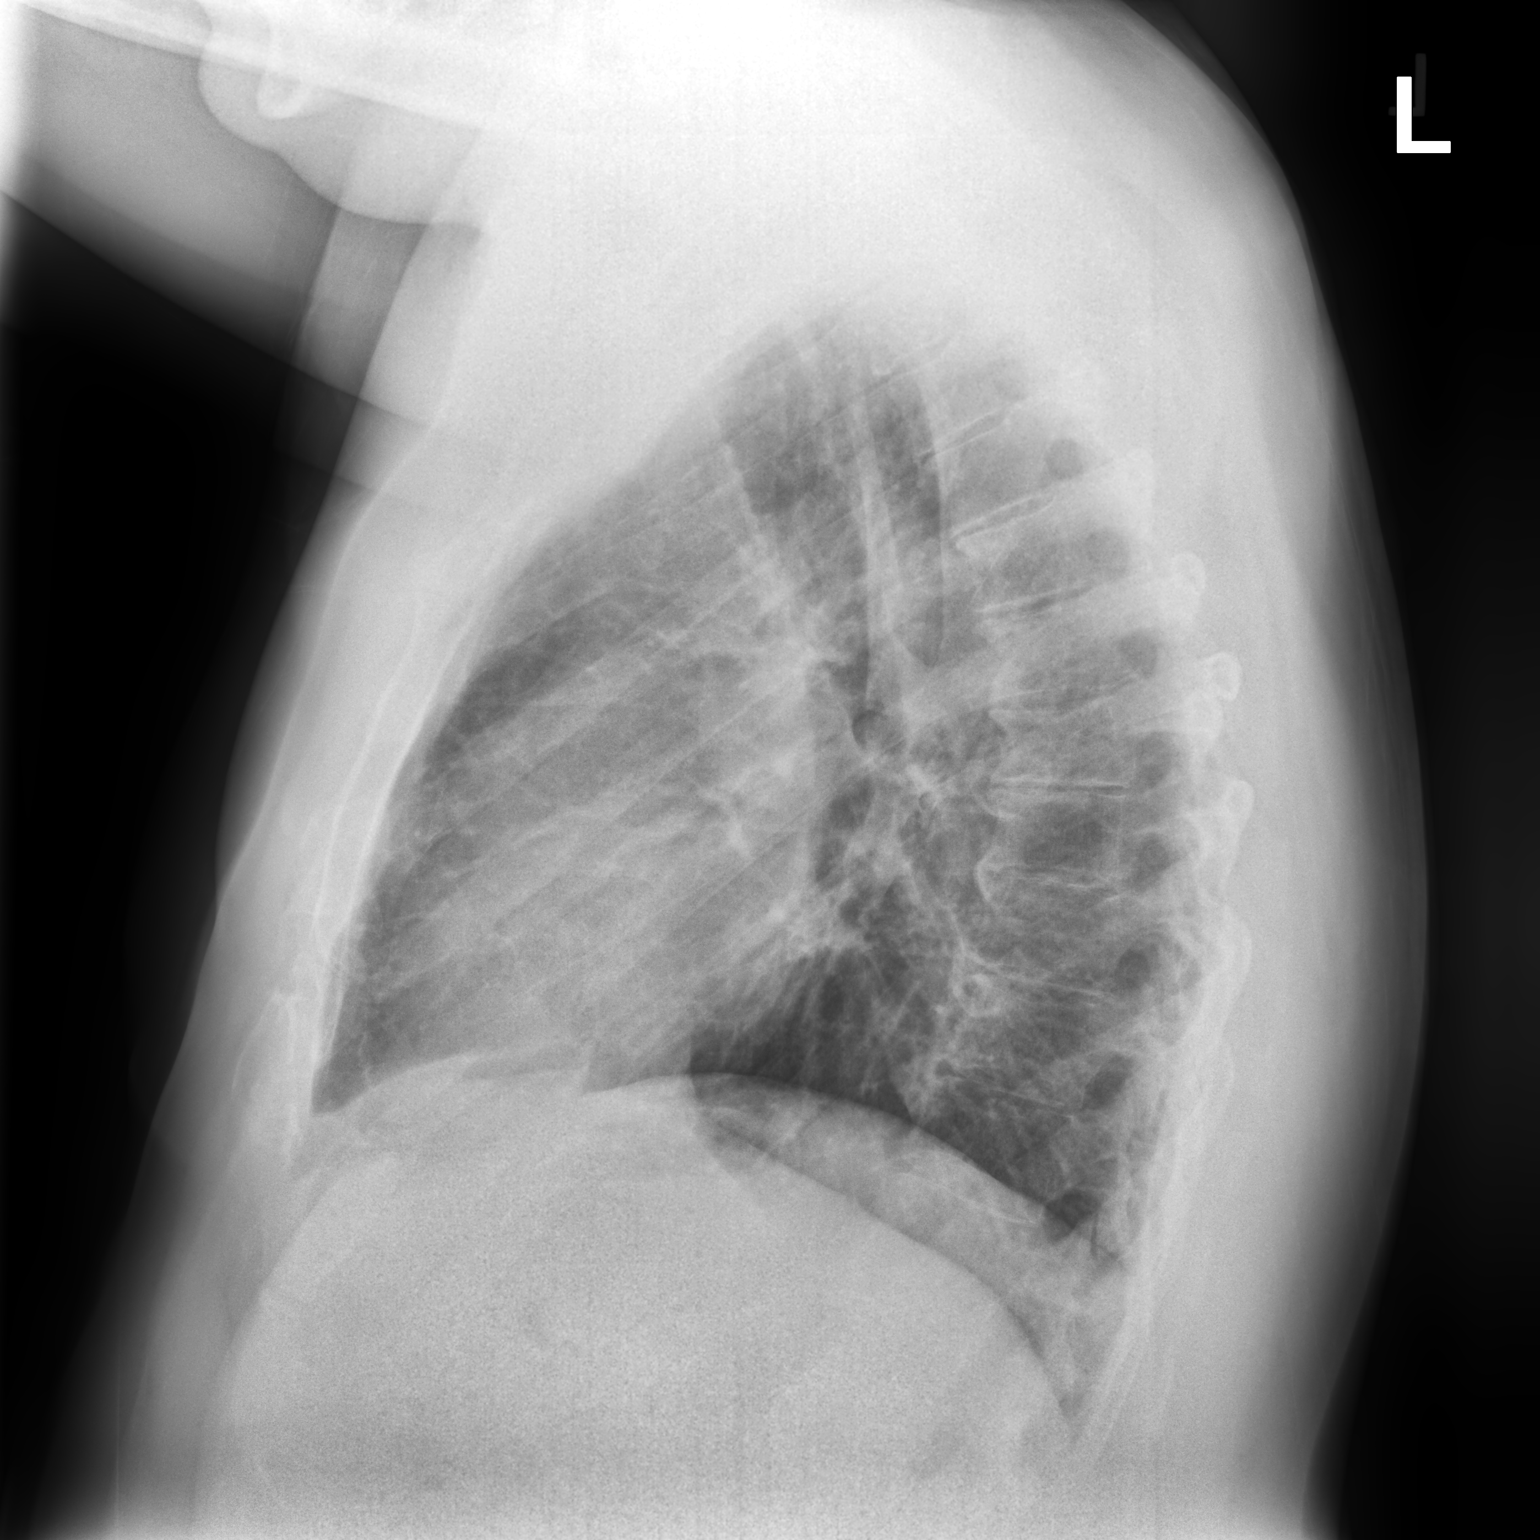

[2 of 2 positions shown; findings below may reference images not displayed]

FINDINGS: Normal heart size. Bibasilar reticulonodular opacities left greater
than right are stable. No pneumothorax. No pleural effusion.
IMPRESSION: Stable bibasilar reticulonodular opacities. This is nonspecific.
Considerations include both chronic and acute inflammatory
processes.

## 2021-04-11 ENCOUNTER — Telehealth: Payer: Self-pay | Admitting: Neurology

## 2021-04-11 ENCOUNTER — Encounter: Payer: Self-pay | Admitting: Neurology

## 2021-04-11 ENCOUNTER — Ambulatory Visit: Payer: 59 | Admitting: Neurology

## 2021-04-11 VITALS — BP 151/104 | HR 112 | Ht 72.0 in | Wt 280.0 lb

## 2021-04-11 DIAGNOSIS — G4710 Hypersomnia, unspecified: Secondary | ICD-10-CM | POA: Diagnosis not present

## 2021-04-11 DIAGNOSIS — R519 Headache, unspecified: Secondary | ICD-10-CM | POA: Diagnosis not present

## 2021-04-11 DIAGNOSIS — G6289 Other specified polyneuropathies: Secondary | ICD-10-CM

## 2021-04-11 DIAGNOSIS — G2581 Restless legs syndrome: Secondary | ICD-10-CM

## 2021-04-11 MED ORDER — TIZANIDINE HCL 4 MG PO TABS: 4.0000 mg | ORAL_TABLET | Freq: Four times a day (QID) | ORAL | 6 refills | Status: DC | PRN

## 2021-04-11 MED ORDER — UBRELVY 100 MG PO TABS: ORAL_TABLET | ORAL | 6 refills | Status: DC

## 2021-04-11 MED ORDER — PREGABALIN 100 MG PO CAPS: 100.0000 mg | ORAL_CAPSULE | Freq: Three times a day (TID) | ORAL | 5 refills | Status: DC

## 2021-04-11 NOTE — Progress Notes (Signed)
Chief Complaint  Patient presents with   Follow-up    Rm 13, here to discuss Gabapentin, state it cause weight gain  Today c/o migraines x 1 week, pain scale 4      ASSESSMENT AND PLAN  Jack PaxStephen P Lawson is a 56 y.o. male   Progressive gait abnormality Bilateral lower extremity paresthesia, neuropathic pain Restless leg syndrome Morbid obesity  On examination, he has morbid obesity, length dependent sensory changes, loss of distal hairs, toes discoloration, brisk reflex of bilateral patella, probable bilateral Babinski signs, wide-based stiff, unsteady gait  Differentiation diagnosis include cervical myelopathy, with superimposed peripheral neuropathy  EMG nerve conduction study  MRI of cervical spine pending  He complains of excessive weight gain, sweating with gabapentin use,  stop gabapentin, he did close about 20 pounds over the past 5 months, Cymbalta 60 mg daily works well for his lower extremity neuropathic pain, but caused erectile dysfunction, will try Lyrica 100 mg 3 times a day,  Continue Mirapex 0.5 mg along with melatonin for sleep  Possible obstructive sleep apnea  Excessive weight gain, frequent awakening at nighttime, gasping for air, excessive fatigue, sleepiness during the day, narrow oropharyngeal space  Referred for sleep study  Persistent migraine headaches,  Previous gabapentin works as a preventive medications  Not had persistent migraine for 1 week, responded well to nerve block today    DIAGNOSTIC DATA (LABS, IMAGING, TESTING) - I reviewed patient records, labs, notes, testing and imaging myself where available.   HISTORICAL  Jack PaxStephen P Lawson, is of 56 year old male, seen in request by his primary care physician Dr. Nedra HaiLee, Marquette SaaKeung, for evaluation of gait abnormality, lower extremity paresthesia, initial evaluation was on Jul 28, 2020.  I reviewed and summarized the referring note.  Past medical history Hypertension Restless leg  syndrome Obesity, Depression  Patient reported gradual decline in functional status since 2020, starting from rapid weight gain, then noticed intermittent shooting pain at toes, gradually getting worse, more frequent, more severe, was diagnosed with peripheral neuropathy, started on gabapentin, which has helped his lower extremity symptoms, but he reported more rapid weight gain, lower extremity swelling,  He began to notice gradual onset mildly unsteady gait, has worked out 6 months persistently for a while, did not improve his stamina, he complains of excessive fatigue, sleepiness during the day, also complains of frequent awakening at nighttime, gasping for air, was recently referred by primary care to ENT for possible acid reflux  He was started on Mirapex 0.5 mg at bedtime for restless leg symptoms, really has helped his sleeping, along with melatonin  He denies bowel and bladder incontinence,  UPDATE Apr 11 2021: He came in today complains of a week history of moderate to severe headaches, more towards the left side, had long history of migraine headaches, previously gabapentin serve as a migraine prevention, this time he has tried multiple dose of NSAIDs, including clonazepam without helping his migraine    PHYSICAL EXAM:   Vitals:   04/11/21 1454  BP: (!) 151/104  Pulse: (!) 112  Weight: 280 lb (127 kg)  Height: 6' (1.829 m)   Not recorded     Body mass index is 37.97 kg/m.  PHYSICAL EXAMNIATION:  Gen: NAD, conversant, well nourised, well groomed            NEUROLOGICAL EXAM:  MENTAL STATUS: Morbid obesity, central obesity, shortness of breath with minimum exertion, oriented to history taking care of conversation   CRANIAL NERVES: CN II: Visual fields are full  to confrontation. Pupils are round equal and briskly reactive to light. CN III, IV, VI: extraocular movement are normal. No ptosis. CN V: Facial sensation is intact to light touch CN VII: Face is symmetric  with normal eye closure  CN VIII: Hearing is normal to causal conversation. CN IX, X: Phonation is normal. CN XI: Head turning and shoulder shrug are intact CN XII: Narrow oropharyngeal  MOTOR: There is no pronator drift of out-stretched arms. Muscle bulk and tone are normal. Muscle strength is normal.  REFLEXES: Reflexes are 2+ and symmetric at the biceps, triceps, 3/3 at knees, and absent at ankles. Plantar responses are extensor bilaterally  SENSORY: Length dependent decreased to light touch, pinprick and vibratory sensation to ankle level, there was loss of hair to distal shin level, bilateral toes discoloration  COORDINATION: There is no trunk or limb dysmetria noted.  GAIT/STANCE: He can get up from seated position arm crossed, limited by his big body habitus, stiff, wide-based, cautious, mildly unsteady, difficulty standing up on tiptoe, heels, mild positive Romberg sign  REVIEW OF SYSTEMS:  Full 14 system review of systems performed and notable only for as above All other review of systems were negative.   ALLERGIES: Allergies  Allergen Reactions   Codeine Nausea And Vomiting    HOME MEDICATIONS: Current Outpatient Medications  Medication Sig Dispense Refill   atenolol-chlorthalidone (TENORETIC) 100-25 MG tablet Take 1 tablet by mouth daily.     Biotin 2500 MCG CAPS Take 1 capsule by mouth daily.     DULoxetine (CYMBALTA) 60 MG capsule Take 1 capsule (60 mg total) by mouth daily. 30 capsule 11   furosemide (LASIX) 40 MG tablet Take 40 mg by mouth 2 (two) times daily.     losartan-hydrochlorothiazide (HYZAAR) 50-12.5 MG tablet Take 1 tablet by mouth 2 (two) times daily.     Melatonin 5 MG CHEW Chew 5 mg by mouth at bedtime. 2 tablets at bedtime     meloxicam (MOBIC) 7.5 MG tablet Take 7.5 mg by mouth 2 (two) times daily.      Multiple Vitamin (MULTIVITAMIN) tablet Take 1 tablet by mouth daily.     pantoprazole (PROTONIX) 40 MG tablet Take 1 tablet by mouth 2 (two)  times daily.     POTASSIUM PO Take 1 tablet by mouth at bedtime.     pramipexole (MIRAPEX) 0.5 MG tablet Take 0.5 mg by mouth at bedtime.     verapamil (VERELAN PM) 180 MG 24 hr capsule Take 180 mg by mouth daily.     No current facility-administered medications for this visit.    PAST MEDICAL HISTORY: Past Medical History:  Diagnosis Date   ADD (attention deficit disorder)    Atrial arrhythmia    Back pain    Depression    Elevated liver enzymes    Esophageal reflux    Family history of colon cancer    Fibromyalgia    GERD (gastroesophageal reflux disease)    HTN (hypertension)    Hyperlipidemia    Hypogonadism in male    IBS (irritable bowel syndrome)    Insomnia    Migraine headache    Osteoarthrosis    Palpitation    Peripheral neuropathy, idiopathic    PSVT (paroxysmal supraventricular tachycardia) (HCC)    RLS (restless legs syndrome)     PAST SURGICAL HISTORY: Past Surgical History:  Procedure Laterality Date   COLONOSCOPY  08/10/2009   Mild sigmoid diverticulosis. Otherwise normal colonoscopy to terminal ileum   COLONOSCOPY  04/24/2017  Colonic polyp status post polypectomy. Moderate predominantly sigmoid diverticulosis.    LIPOMA RESECTION      FAMILY HISTORY: Family History  Problem Relation Age of Onset   Deep vein thrombosis Mother    Pulmonary embolism Mother    Hypertension Mother    CVA Mother    Deep vein thrombosis Maternal Aunt    Colon cancer Maternal Aunt    Breast cancer Paternal Aunt    Colon polyps Father     SOCIAL HISTORY: Social History   Socioeconomic History   Marital status: Single    Spouse name: Not on file   Number of children: 0   Years of education: 14   Highest education level: Not on file  Occupational History   Occupation: Engineer, civil (consulting)  Tobacco Use   Smoking status: Never   Smokeless tobacco: Never  Vaping Use   Vaping Use: Never used  Substance and Sexual Activity   Alcohol use: Yes    Comment:  rare   Drug use: Never   Sexual activity: Not on file  Other Topics Concern   Not on file  Social History Narrative   Lives w/ roommate   Caffeine use: none   Right handed    Social Determinants of Health   Financial Resource Strain: Not on file  Food Insecurity: Not on file  Transportation Needs: Not on file  Physical Activity: Not on file  Stress: Not on file  Social Connections: Not on file  Intimate Partner Violence: Not on file      Levert Feinstein, M.D. Ph.D.  [facility] [address] [place], [place] [address] Ph: [phone] Fax: [phone]  CC:  [name], MD [address] [place],  [place] [address]  [name], MD

## 2021-04-11 NOTE — Telephone Encounter (Signed)
Dr. Terrace Arabia had a cancellation this afternoon. The patient is coming to see her today.   Never finished initial work-up. Unable to afford MRI. Did no r/s NCV/EMG. Never had sleep study.

## 2021-04-11 NOTE — Telephone Encounter (Signed)
Pt has called asking if he could be seen today as a result of the complications he has had on current migraine medication(pt does not recall the name).  Pt said he has had these complications from day one on current medication.  Pt said on previous medication it helped and he didn't have a migraine for 2 years but it caused him to gain 70 pounds .  Pt said he is at a point that he would almost be willing to go back to previous medication.  Phone rep asked if pt feels he needs to go to ED he said he is getting there,please call.  This is being sent as a High Priority per pt request since there is no office visit slot available for him to see Dr Terrace Arabia today.

## 2021-04-11 NOTE — Patient Instructions (Addendum)
Team Member Role and Specialty Contact Info Address Start End Comments  Dennard Nip D, MD Consulting Physician (Family Medicine) Phone: [phone] Fax: [phone]  [place] [address] 04/11/2021 - -   Meds ordered this encounter  Medications   Ubrogepant (UBRELVY) 100 MG TABS    Sig: Take 1 tab at onset of migraine.  May repeat in 2 hrs, if needed.  Max dose: 2 tabs/day. This is a 30 day prescription.    Dispense:  12 tablet    Refill:  6   tiZANidine (ZANAFLEX) 4 MG tablet    Sig: Take 1 tablet (4 mg total) by mouth every 6 (six) hours as needed.    Dispense:  30 tablet    Refill:  6     Ubrelvy as needed, may combine with tizanidine as needed for muscle relaxant, Zofran as needed for nausea,  Aleve(or Mobic) as needed, sleep, heating pad,

## 2021-04-11 NOTE — Progress Notes (Signed)
° °  History: Persistent headache for 1 week, more on the left side, failed home remedies including multiple dose of NSAIDs, clonazepam as needed    Bilateral occipital and trigeminal nerve block; trigger point injection of bilateral cervical and upper trapezius muscles for intractable headache  Bupivacaine 0.5% was injected on the scalp bilaterally at several locations:  -On the occipital area of the head, 3 injections each side, 0.5 cc per injection at the midpoint between the mastoid process and the occipital protuberance. 2 other injections were done one finger breadth from the initial injection, one at a 10 o'clock position and the other at a 2 o'clock position.  -2 injections of 0.5 cc were done in the temporal regions, 2 fingerbreadths above the tragus of the ear, with the second injection one fingerbreadth posteriorly to the first.  -2 injections were done on the brow, 1 in the medial brow and one over the supraorbital nerve notch, with 0.1 cc for each injection  -1 injection each side of 0.5 cc was done anterior to the tragus of the ear for a trigeminal ganglion injection  -0.5 cc was injected into bilateral upper trapezius and bilateral upper cervical paraspinals   The patient tolerated the injections well, no complications of the procedure were noted. Injections were made with a 27-gauge needle.

## 2021-04-30 ENCOUNTER — Institutional Professional Consult (permissible substitution): Payer: 59 | Admitting: Neurology

## 2021-04-30 ENCOUNTER — Telehealth: Payer: Self-pay | Admitting: Neurology

## 2021-04-30 NOTE — Telephone Encounter (Signed)
LVM and sent mychart msg- MD out.

## 2021-05-22 ENCOUNTER — Telehealth: Payer: Self-pay | Admitting: *Deleted

## 2021-05-22 NOTE — Telephone Encounter (Signed)
PA for pregabalin 100mg  started on covermymeds (key: [id number]). Pt has pharmacy coverage through [employer] [phone]). Decision pending. ?

## 2021-05-23 NOTE — Telephone Encounter (Signed)
I checked goodrx.com and the pregabalin can be purchased out of pocket for a reasonable amount (anywhere from 17-35 dollars for 90 capsules). His current pharmacy is [id number]. He would like to keep his prescription there.  ? ?Says the medication is working well for his symptoms. He also feels it has improved his migraines.  ? ?Dr. Terrace Arabia has been provided with this update.  ?

## 2021-05-23 NOTE — Telephone Encounter (Signed)
Please let patient know, insurance would not cover Lyrica with current diagnosis ? ?We may consider start gabapentin, 300 mg 3 times daily, titrating to 2 tablets 3 times a day ?

## 2021-06-06 ENCOUNTER — Encounter: Payer: Self-pay | Admitting: Neurology

## 2021-07-19 ENCOUNTER — Encounter: Payer: Self-pay | Admitting: Neurology

## 2021-07-19 ENCOUNTER — Ambulatory Visit: Payer: 59 | Admitting: Neurology

## 2021-07-19 ENCOUNTER — Telehealth: Payer: Self-pay | Admitting: Neurology

## 2021-07-19 DIAGNOSIS — G6289 Other specified polyneuropathies: Secondary | ICD-10-CM | POA: Diagnosis not present

## 2021-07-19 DIAGNOSIS — G43709 Chronic migraine without aura, not intractable, without status migrainosus: Secondary | ICD-10-CM

## 2021-07-19 DIAGNOSIS — G2581 Restless legs syndrome: Secondary | ICD-10-CM

## 2021-07-19 NOTE — Progress Notes (Signed)
? ?Chief Complaint  ?Patient presents with  ? Follow-up  ?  Room 12 - alone. Migraines have improved. His bilateral lower extremity neuropathy is much worse. He is have difficulty walking now. Feet stay swollen.   ? ? ? ? ?ASSESSMENT AND PLAN ? ?Jack Lawson is a 56 y.o. male   ?Progressive gait abnormality ?Bilateral lower extremity paresthesia,  ?Restless leg syndrome ?Morbid obesity ? On examination, he has morbid obesity, length dependent sensory changes, loss of distal hairs, toes discoloration, brisk reflex of bilateral patella, probable bilateral Babinski signs, wide-based stiff, unsteady gait ? Differentiation diagnosis include cervical myelopathy, with superimposed peripheral neuropathy ? Evaluation for potential etiology of peripheral neuropathy ? MRI of cervical spine pending ? He complains of excessive weight gain, sweating with gabapentin use,  stop gabapentin, he did loose about 20 pounds over the past 5 months, Cymbalta 60 mg daily works well for his lower extremity neuropathic pain, but caused erectile dysfunction, Lyrica works better ? Continue Mirapex 0.5 mg along with melatonin for sleep ? ?Possible obstructive sleep apnea, ? Excessive weight gain, frequent awakening at nighttime, gasping for air, excessive fatigue, sleepiness during the day, narrow oropharyngeal space ? Referred for sleep study ? Refer to weight management program ?  ?Persistent migraine headaches, ? Responded very well to nerve block, ? Has improved with Lyrica 100 mg 3 times a day ? ?  ? ?DIAGNOSTIC DATA (LABS, IMAGING, TESTING) ?- I reviewed patient records, labs, notes, testing and imaging myself where available. ? ? ?HISTORICAL ? ?Jack Lawson, is of 56 year old male, seen in request by his primary care physician Dr. Nedra Hai, [place], for evaluation of gait abnormality, lower extremity paresthesia, initial evaluation was on Jul 28, 2020. ? ?I reviewed and summarized the referring note.  Past medical  history ?Hypertension ?Restless leg syndrome ?Obesity, ?Depression ? ?Patient reported gradual decline in functional status since 2020, starting from rapid weight gain, then noticed intermittent shooting pain at toes, gradually getting worse, more frequent, more severe, was diagnosed with peripheral neuropathy, started on gabapentin, which has helped his lower extremity symptoms, but he reported more rapid weight gain, lower extremity swelling, ? ?He began to notice gradual onset mildly unsteady gait, has worked out 6 months persistently for a while, did not improve his stamina, he complains of excessive fatigue, sleepiness during the day, also complains of frequent awakening at nighttime, gasping for air, was recently referred by primary care to ENT for possible acid reflux ? ?He was started on Mirapex 0.5 mg at bedtime for restless leg symptoms, really has helped his sleeping, along with melatonin ? ?He denies bowel and bladder incontinence, ? ?UPDATE Apr 11 2021: ?He came in today complains of a week history of moderate to severe headaches, more towards the left side, had long history of migraine headaches, previously gabapentin serve as a migraine prevention, this time he has tried multiple dose of NSAIDs, including clonazepam without helping his migraine ? ?UPDATE Jul 19 2021: ?Complains of worsening bilateral lower extremity pain, gait abnormality, worsening, he denies neck pain, no bilateral upper extremity paresthesia or weakness, ? ?He also has significant bilateral foot pain, radiating pain from feet to leg, he denies incontinence, ? ?His headache has much improved with Lyrica treatment ? ?PHYSICAL EXAM: ?  ?Vitals:  ? 07/19/21 0739  ?BP: 130/90  ?Pulse: (!) 101  ?Weight: 296 lb 8 oz (134.5 kg)  ?Height: 6' (1.829 m)  ? ? ? ?Body mass index is 40.21 kg/m?. ? ?PHYSICAL EXAMNIATION: ? ?  Gen: NAD, conversant, well nourised, well groomed           ? ?NEUROLOGICAL EXAM: ? ?MENTAL STATUS: Morbid obesity, central  obesity, shortness of breath with minimum exertion, oriented to history taking care of conversation ?  ?CRANIAL NERVES: ?CN II: Visual fields are full to confrontation. Pupils are round equal and briskly reactive to light. ?CN III, IV, VI: extraocular movement are normal. No ptosis. ?CN V: Facial sensation is intact to light touch ?CN VII: Face is symmetric with normal eye closure  ?CN VIII: Hearing is normal to causal conversation. ?CN IX, X: Phonation is normal. ?CN XI: Head turning and shoulder shrug are intact ?CN XII: Narrow oropharyngeal ? ?MOTOR: ?There is no pronator drift of out-stretched arms. Muscle bulk and tone are normal. Muscle strength is normal. ? ?REFLEXES: ?Reflexes are 2+ and symmetric at the biceps, triceps, 3/3 at knees, and absent at ankles. Plantar responses are extensor bilaterally ? ?SENSORY: ?Length dependent decreased to light touch, pinprick and vibratory sensation to ankle level, there was loss of hair to distal shin level, bilateral toes discoloration, significant tenderness bilateral dorsal ? ?COORDINATION: ?There is no trunk or limb dysmetria noted. ? ?GAIT/STANCE: ?He can get up from seated position arm crossed, limited by his big body habitus, stiff, wide-based, cautious, mildly unsteady, difficulty standing up on tiptoe, heels, mild positive Romberg sign ? ?REVIEW OF SYSTEMS:  ?Full 14 system review of systems performed and notable only for as above ?All other review of systems were negative. ? ? ?ALLERGIES: ?Allergies  ?Allergen Reactions  ? Codeine Nausea And Vomiting  ? ? ?HOME MEDICATIONS: ?Current Outpatient Medications  ?Medication Sig Dispense Refill  ? atenolol-chlorthalidone (TENORETIC) 100-25 MG tablet Take 1 tablet by mouth daily.    ? Biotin 2500 MCG CAPS Take 1 capsule by mouth daily.    ? DULoxetine (CYMBALTA) 60 MG capsule Take 1 capsule (60 mg total) by mouth daily. 30 capsule 11  ? furosemide (LASIX) 40 MG tablet Take 40 mg by mouth 2 (two) times daily.    ?  losartan-hydrochlorothiazide (HYZAAR) 50-12.5 MG tablet Take 1 tablet by mouth 2 (two) times daily.    ? Melatonin 5 MG CHEW Chew 5 mg by mouth at bedtime. 2 tablets at bedtime    ? meloxicam (MOBIC) 7.5 MG tablet Take 7.5 mg by mouth 2 (two) times daily.     ? Multiple Vitamin (MULTIVITAMIN) tablet Take 1 tablet by mouth daily.    ? pantoprazole (PROTONIX) 40 MG tablet Take 1 tablet by mouth 2 (two) times daily.    ? POTASSIUM PO Take 1 tablet by mouth at bedtime.    ? pramipexole (MIRAPEX) 0.5 MG tablet Take 0.5 mg by mouth at bedtime.    ? pregabalin (LYRICA) 100 MG capsule Take 1 capsule (100 mg total) by mouth 3 (three) times daily. 90 capsule 5  ? tiZANidine (ZANAFLEX) 4 MG tablet Take 1 tablet (4 mg total) by mouth every 6 (six) hours as needed. 30 tablet 6  ? Ubrogepant (UBRELVY) 100 MG TABS Take 1 tab at onset of migraine.  May repeat in 2 hrs, if needed.  Max dose: 2 tabs/day. This is a 30 day prescription. 12 tablet 6  ? verapamil (VERELAN PM) 180 MG 24 hr capsule Take 180 mg by mouth daily.    ? ?No current facility-administered medications for this visit.  ? ? ?PAST MEDICAL HISTORY: ?Past Medical History:  ?Diagnosis Date  ? ADD (attention deficit disorder)   ? Atrial  arrhythmia   ? Back pain   ? Depression   ? Elevated liver enzymes   ? Esophageal reflux   ? Family history of colon cancer   ? Fibromyalgia   ? GERD (gastroesophageal reflux disease)   ? HTN (hypertension)   ? Hyperlipidemia   ? Hypogonadism in male   ? IBS (irritable bowel syndrome)   ? Insomnia   ? Migraine headache   ? Osteoarthrosis   ? Palpitation   ? Peripheral neuropathy, idiopathic   ? PSVT (paroxysmal supraventricular tachycardia) (HCC)   ? RLS (restless legs syndrome)   ? ? ?PAST SURGICAL HISTORY: ?Past Surgical History:  ?Procedure Laterality Date  ? COLONOSCOPY  08/10/2009  ? Mild sigmoid diverticulosis. Otherwise normal colonoscopy to terminal ileum  ? COLONOSCOPY  04/24/2017  ? Colonic polyp status post polypectomy. Moderate  predominantly sigmoid diverticulosis.   ? LIPOMA RESECTION    ? ? ?FAMILY HISTORY: ?Family History  ?Problem Relation Age of Onset  ? Deep vein thrombosis Mother   ? Pulmonary embolism Mother   ? Hypertension Mother   ? CVA Mot

## 2021-07-19 NOTE — Telephone Encounter (Signed)
Sent to GI they obtain Drucie Opitz and schedule ?

## 2021-07-23 LAB — CBC WITH DIFFERENTIAL/PLATELET
Basophils Absolute: 0.1 10*3/uL (ref 0.0–0.2)
Basos: 1 %
EOS (ABSOLUTE): 0.2 10*3/uL (ref 0.0–0.4)
Eos: 3 %
Hematocrit: 45.7 % (ref 37.5–51.0)
Hemoglobin: 15.7 g/dL (ref 13.0–17.7)
Immature Grans (Abs): 0 10*3/uL (ref 0.0–0.1)
Immature Granulocytes: 0 %
Lymphocytes Absolute: 3.4 10*3/uL — ABNORMAL HIGH (ref 0.7–3.1)
Lymphs: 40 %
MCH: 30.6 pg (ref 26.6–33.0)
MCHC: 34.4 g/dL (ref 31.5–35.7)
MCV: 89 fL (ref 79–97)
Monocytes Absolute: 0.7 10*3/uL (ref 0.1–0.9)
Monocytes: 9 %
Neutrophils Absolute: 4 10*3/uL (ref 1.4–7.0)
Neutrophils: 47 %
Platelets: 233 10*3/uL (ref 150–450)
RBC: 5.13 x10E6/uL (ref 4.14–5.80)
RDW: 12.6 % (ref 11.6–15.4)
WBC: 8.4 10*3/uL (ref 3.4–10.8)

## 2021-07-23 LAB — MULTIPLE MYELOMA PANEL, SERUM
Albumin SerPl Elph-Mcnc: 3.9 g/dL (ref 2.9–4.4)
Albumin/Glob SerPl: 1.3 (ref 0.7–1.7)
Alpha 1: 0.3 g/dL (ref 0.0–0.4)
Alpha2 Glob SerPl Elph-Mcnc: 0.6 g/dL (ref 0.4–1.0)
B-Globulin SerPl Elph-Mcnc: 1.2 g/dL (ref 0.7–1.3)
Gamma Glob SerPl Elph-Mcnc: 1 g/dL (ref 0.4–1.8)
Globulin, Total: 3.1 g/dL (ref 2.2–3.9)
IgA/Immunoglobulin A, Serum: 309 mg/dL (ref 90–386)
IgG (Immunoglobin G), Serum: 958 mg/dL (ref 603–1613)
IgM (Immunoglobulin M), Srm: 110 mg/dL (ref 20–172)

## 2021-07-23 LAB — COMPREHENSIVE METABOLIC PANEL
ALT: 125 IU/L — ABNORMAL HIGH (ref 0–44)
AST: 68 IU/L — ABNORMAL HIGH (ref 0–40)
Albumin/Globulin Ratio: 1.7 (ref 1.2–2.2)
Albumin: 4.4 g/dL (ref 3.8–4.9)
Alkaline Phosphatase: 74 IU/L (ref 44–121)
BUN/Creatinine Ratio: 17 (ref 9–20)
BUN: 22 mg/dL (ref 6–24)
Bilirubin Total: 0.5 mg/dL (ref 0.0–1.2)
CO2: 23 mmol/L (ref 20–29)
Calcium: 9.3 mg/dL (ref 8.7–10.2)
Chloride: 106 mmol/L (ref 96–106)
Creatinine, Ser: 1.32 mg/dL — ABNORMAL HIGH (ref 0.76–1.27)
Globulin, Total: 2.6 g/dL (ref 1.5–4.5)
Glucose: 108 mg/dL — ABNORMAL HIGH (ref 70–99)
Potassium: 4.7 mmol/L (ref 3.5–5.2)
Sodium: 145 mmol/L — ABNORMAL HIGH (ref 134–144)
Total Protein: 7 g/dL (ref 6.0–8.5)
eGFR: 64 mL/min/{1.73_m2} (ref >59)

## 2021-07-23 LAB — ANA W/REFLEX IF POSITIVE: Anti Nuclear Antibody (ANA): NEGATIVE

## 2021-07-23 LAB — HGB A1C W/O EAG: Hgb A1c MFr Bld: 6 % — ABNORMAL HIGH (ref 4.8–5.6)

## 2021-07-23 LAB — BRAIN NATRIURETIC PEPTIDE: BNP: 29.4 pg/mL (ref 0.0–100.0)

## 2021-07-23 LAB — VITAMIN D 25 HYDROXY (VIT D DEFICIENCY, FRACTURES): Vit D, 25-Hydroxy: 27.4 ng/mL — ABNORMAL LOW (ref 30.0–100.0)

## 2021-07-23 LAB — CK: Total CK: 267 U/L (ref 41–331)

## 2021-07-23 LAB — C-REACTIVE PROTEIN: CRP: 3 mg/L (ref 0–10)

## 2021-07-23 LAB — HIV ANTIBODY (ROUTINE TESTING W REFLEX): HIV Screen 4th Generation wRfx: NONREACTIVE

## 2021-07-23 LAB — FOLATE: Folate: >20 ng/mL (ref >3.0)

## 2021-07-23 LAB — RPR: RPR Ser Ql: NONREACTIVE

## 2021-07-23 LAB — TSH: TSH: 2.31 u[IU]/mL (ref 0.450–4.500)

## 2021-07-23 LAB — VITAMIN B12: Vitamin B-12: 433 pg/mL (ref 232–1245)

## 2021-07-23 LAB — SEDIMENTATION RATE: Sed Rate: 14 mm/hr (ref 0–30)

## 2021-08-08 DIAGNOSIS — J209 Acute bronchitis, unspecified: Secondary | ICD-10-CM | POA: Diagnosis not present

## 2021-08-09 ENCOUNTER — Ambulatory Visit: Payer: Self-pay | Admitting: Neurology

## 2021-08-15 DIAGNOSIS — J309 Allergic rhinitis, unspecified: Secondary | ICD-10-CM | POA: Diagnosis not present

## 2021-08-15 DIAGNOSIS — I10 Essential (primary) hypertension: Secondary | ICD-10-CM | POA: Diagnosis not present

## 2021-08-15 DIAGNOSIS — M159 Polyosteoarthritis, unspecified: Secondary | ICD-10-CM | POA: Diagnosis not present

## 2021-08-15 DIAGNOSIS — K589 Irritable bowel syndrome without diarrhea: Secondary | ICD-10-CM | POA: Diagnosis not present

## 2021-08-15 DIAGNOSIS — R748 Abnormal levels of other serum enzymes: Secondary | ICD-10-CM | POA: Diagnosis not present

## 2021-08-15 DIAGNOSIS — R600 Localized edema: Secondary | ICD-10-CM | POA: Diagnosis not present

## 2021-08-15 DIAGNOSIS — G4700 Insomnia, unspecified: Secondary | ICD-10-CM | POA: Diagnosis not present

## 2021-08-15 DIAGNOSIS — J208 Acute bronchitis due to other specified organisms: Secondary | ICD-10-CM | POA: Diagnosis not present

## 2021-08-15 DIAGNOSIS — R69 Illness, unspecified: Secondary | ICD-10-CM | POA: Diagnosis not present

## 2021-08-15 DIAGNOSIS — G43109 Migraine with aura, not intractable, without status migrainosus: Secondary | ICD-10-CM | POA: Diagnosis not present

## 2021-08-15 DIAGNOSIS — G5601 Carpal tunnel syndrome, right upper limb: Secondary | ICD-10-CM | POA: Diagnosis not present

## 2021-08-15 DIAGNOSIS — G609 Hereditary and idiopathic neuropathy, unspecified: Secondary | ICD-10-CM | POA: Diagnosis not present

## 2021-08-27 DIAGNOSIS — E291 Testicular hypofunction: Secondary | ICD-10-CM | POA: Diagnosis not present

## 2021-08-27 DIAGNOSIS — R69 Illness, unspecified: Secondary | ICD-10-CM | POA: Diagnosis not present

## 2021-08-27 DIAGNOSIS — K589 Irritable bowel syndrome without diarrhea: Secondary | ICD-10-CM | POA: Diagnosis not present

## 2021-08-27 DIAGNOSIS — J069 Acute upper respiratory infection, unspecified: Secondary | ICD-10-CM | POA: Diagnosis not present

## 2021-08-27 DIAGNOSIS — I10 Essential (primary) hypertension: Secondary | ICD-10-CM | POA: Diagnosis not present

## 2021-08-27 DIAGNOSIS — Z6838 Body mass index (BMI) 38.0-38.9, adult: Secondary | ICD-10-CM | POA: Diagnosis not present

## 2021-08-27 DIAGNOSIS — G5601 Carpal tunnel syndrome, right upper limb: Secondary | ICD-10-CM | POA: Diagnosis not present

## 2021-08-27 DIAGNOSIS — R600 Localized edema: Secondary | ICD-10-CM | POA: Diagnosis not present

## 2021-08-27 DIAGNOSIS — G609 Hereditary and idiopathic neuropathy, unspecified: Secondary | ICD-10-CM | POA: Diagnosis not present

## 2021-08-27 DIAGNOSIS — R490 Dysphonia: Secondary | ICD-10-CM | POA: Diagnosis not present

## 2021-08-27 DIAGNOSIS — R748 Abnormal levels of other serum enzymes: Secondary | ICD-10-CM | POA: Diagnosis not present

## 2021-08-27 DIAGNOSIS — G43109 Migraine with aura, not intractable, without status migrainosus: Secondary | ICD-10-CM | POA: Diagnosis not present

## 2021-08-28 DIAGNOSIS — G5601 Carpal tunnel syndrome, right upper limb: Secondary | ICD-10-CM | POA: Diagnosis not present

## 2021-09-04 DIAGNOSIS — A080 Rotaviral enteritis: Secondary | ICD-10-CM | POA: Diagnosis not present

## 2021-09-04 DIAGNOSIS — R000 Tachycardia, unspecified: Secondary | ICD-10-CM | POA: Diagnosis not present

## 2021-09-04 DIAGNOSIS — R112 Nausea with vomiting, unspecified: Secondary | ICD-10-CM | POA: Diagnosis not present

## 2021-10-04 DIAGNOSIS — Z0289 Encounter for other administrative examinations: Secondary | ICD-10-CM

## 2021-10-23 ENCOUNTER — Ambulatory Visit (INDEPENDENT_AMBULATORY_CARE_PROVIDER_SITE_OTHER): Payer: 59 | Admitting: Family Medicine

## 2021-10-24 ENCOUNTER — Encounter (INDEPENDENT_AMBULATORY_CARE_PROVIDER_SITE_OTHER): Payer: Self-pay

## 2021-11-06 ENCOUNTER — Ambulatory Visit (INDEPENDENT_AMBULATORY_CARE_PROVIDER_SITE_OTHER): Payer: 59 | Admitting: Family Medicine

## 2022-03-18 HISTORY — PX: SKIN SURGERY: SHX2413

## 2022-04-12 ENCOUNTER — Other Ambulatory Visit: Payer: Self-pay | Admitting: Neurology

## 2022-04-15 ENCOUNTER — Other Ambulatory Visit: Payer: Self-pay | Admitting: Neurology

## 2022-04-15 MED ORDER — PREGABALIN 100 MG PO CAPS: 100.0000 mg | ORAL_CAPSULE | Freq: Three times a day (TID) | ORAL | 5 refills | Status: DC

## 2022-04-15 NOTE — Telephone Encounter (Signed)
Pt is calling requesting a refill on  pregabalin (LYRICA) 100 MG capsule. Refill should be sent to [facility].

## 2022-04-15 NOTE — Telephone Encounter (Addendum)
    Meds ordered this encounter  Medications   pregabalin (LYRICA) 100 MG capsule    Sig: Take 1 capsule (100 mg total) by mouth 3 (three) times daily.    Dispense:  90 capsule    Refill:  5

## 2022-04-15 NOTE — Addendum Note (Signed)
Addended by: Marcial Pacas on: 04/15/2022 05:17 PM   Modules accepted: Orders

## 2022-04-15 NOTE — Telephone Encounter (Signed)
Sent to Dr. Krista Blue to review and fill

## 2022-04-17 ENCOUNTER — Ambulatory Visit: Payer: 59 | Admitting: Medical

## 2022-04-17 VITALS — BP 130/90 | HR 101 | Ht 70.0 in | Wt 277.2 lb

## 2022-04-17 DIAGNOSIS — I10 Essential (primary) hypertension: Secondary | ICD-10-CM

## 2022-04-17 DIAGNOSIS — R7301 Impaired fasting glucose: Secondary | ICD-10-CM | POA: Diagnosis not present

## 2022-04-17 DIAGNOSIS — Z79899 Other long term (current) drug therapy: Secondary | ICD-10-CM | POA: Diagnosis not present

## 2022-04-17 DIAGNOSIS — F902 Attention-deficit hyperactivity disorder, combined type: Secondary | ICD-10-CM | POA: Diagnosis not present

## 2022-04-17 DIAGNOSIS — B353 Tinea pedis: Secondary | ICD-10-CM | POA: Diagnosis not present

## 2022-04-17 DIAGNOSIS — Z8659 Personal history of other mental and behavioral disorders: Secondary | ICD-10-CM

## 2022-04-17 DIAGNOSIS — G6289 Other specified polyneuropathies: Secondary | ICD-10-CM

## 2022-04-17 DIAGNOSIS — R5383 Other fatigue: Secondary | ICD-10-CM | POA: Diagnosis not present

## 2022-04-17 DIAGNOSIS — R000 Tachycardia, unspecified: Secondary | ICD-10-CM | POA: Diagnosis not present

## 2022-04-17 DIAGNOSIS — R69 Illness, unspecified: Secondary | ICD-10-CM | POA: Diagnosis not present

## 2022-04-17 DIAGNOSIS — L989 Disorder of the skin and subcutaneous tissue, unspecified: Secondary | ICD-10-CM

## 2022-04-17 MED ORDER — OMEPRAZOLE 40 MG PO CPDR: 40.0000 mg | DELAYED_RELEASE_CAPSULE | Freq: Every day | ORAL | 0 refills | Status: DC

## 2022-04-17 MED ORDER — LISINOPRIL-HYDROCHLOROTHIAZIDE 10-12.5 MG PO TABS: 1.0000 | ORAL_TABLET | Freq: Every day | ORAL | 1 refills | Status: DC

## 2022-04-17 MED ORDER — FLUCONAZOLE 100 MG PO TABS: 100.0000 mg | ORAL_TABLET | Freq: Every day | ORAL | 0 refills | Status: DC

## 2022-04-17 MED ORDER — ATOMOXETINE HCL 25 MG PO CAPS: 25.0000 mg | ORAL_CAPSULE | Freq: Two times a day (BID) | ORAL | 0 refills | Status: DC

## 2022-04-17 MED ORDER — VERAPAMIL HCL ER 180 MG PO TBCR: 180.0000 mg | EXTENDED_RELEASE_TABLET | Freq: Every day | ORAL | 0 refills | Status: DC

## 2022-04-17 MED ORDER — CLOTRIMAZOLE-BETAMETHASONE 1-0.05 % EX CREA: 1.0000 | TOPICAL_CREAM | Freq: Every day | CUTANEOUS | 0 refills | Status: DC

## 2022-04-17 NOTE — Patient Instructions (Signed)
You have multiple concerns today  High blood pressure Continue verapamil 180 mg daily Begin trial of lisinopril HCT.  You have been on lisinopril in the past so hopefully this will help control blood pressure better  Tachycardia, fast heart rate Limit or avoid caffeine Limit or avoid alcohol Limit or be careful with sugar intake Work on losing weight Consider follow-up with cardiology Continue verapamil for now   ADHD Begin trial of Strattera 40 mg once daily for the first week.  This can be increased to twice daily but you should be able to tell a difference if the medicine is working from morning to afternoon or once daily Lets plan to follow-up in a month on this medication but call sooner if any issues or side effects   Neuropathy Currently under the care of neurology, on Lyrica   Skin lesions You have 3 distinct skin lesions in addition to the foot fungus I am not sure what to make of the lesion on your leg and the right abdomen yet There is a possibility it could be fungal but it is a little bit more red than typically a fungus rash looks so could be something else well Begin Lotrisone cream topically to the left lower leg skin lesion and the right abdomen skin lesion for no more than 2 weeks Begin oral Diflucan for 1 week I will see you back within the next month If the skin lesions on your leg and abdomen do not improve you and you may ultimately need to have biopsy or see a surgeon   Foot fungus Begin oral Diflucan once daily for a week Begin topical Lotrisone cream to the affected feet   Prediabetes on labs back in May 2023 Lets plan to recheck some labs next visit fasting\\  Prediabetes means you have a higher than normal blood sugar level. It's not high enough to be considered type 2 diabetes yet, but without making some lifestyle changes you are more likely to develop type 2 diabetes.  If you have prediabetes, the long-term damage of diabetes, especially to your  heart, blood vessels and kidneys may already be starting. You may not be able to change certain risk factors such as age, race, or family history, but you CAN make changes to your lifestyle, your eating habits, and your activity.  Although diabetes can develop at any age, the risk of prediabetes increases after age 13.  Your risk of prediabetes increases if you have a parent or sibling with type 2 diabetes.   Although it's unclear why, certain people including Black, Hispanic, American [place] American people, are more likely to develop prediabetes.  Ways to prevent or slow progression to diabetes: Eat healthy foods - Eating red meat and processed meat, and drinking sugar-sweetened beverages, is associated with a higher risk of prediabetes. A diet high in fruits, vegetables, nuts, whole grains and olive oil is associated with a lower risk of prediabetes. Get at least 150 minutes of moderate aerobic physical activity a week, or about 30 minutes on most days of the week.  The less active you are, the greater your risk of prediabetes. Physical activity helps you control your weight, uses up sugar for energy and makes the body use insulin more effectively. Lose excess weight - Being overweight is a primary risk factor for prediabetes. The more fatty tissue you have, especially inside and between the muscle and skin around your abdomen, the more resistant your cells become to insulin. Waist size. A  large waist size can indicate insulin resistance. The risk of insulin resistance goes up for men with waists larger than 40 inches and for women with waists larger than 35 inches. Control your blood pressure and cholesterol.  If your blood pressure is not 130/80 or less, discuss with your provider to help get this under control.  It is ideal to have an HDL good cholesterol number >50 and have a LDL bad cholesterol number <100.   Don't smoke One simple strategy to help you make good food choices and eat  appropriate portions sizes is to divide up your plate. These three divisions on your plate promote healthy eating:  One-half: fruit and nonstarchy vegetables One-quarter: whole grains One-quarter: protein-rich foods, such as legumes, fish or lean meats

## 2022-04-17 NOTE — Progress Notes (Signed)
Subjective:  Jack Lawson is a 57 y.o. male who presents for Chief Complaint  Patient presents with   get established    Get established. Needs refills. Needs to get back on ADHD Adderall  since he is going back to school, needs to go on [place], PQ-9 is abnormal, would like updated flu and covid     Here to establish care.  Was seeing Dr. Hulen Shouts in [place].   Wanted to try different practice.  Has seen cardiology in the past, Dr. Bettina Gavia. Dr. Krista Blue, neurologist for neuropathy and migraines  He notes hx/o ADHD.  Hasn't been on medication for years.  Would like to restart this since he is back in school.   Was getting this prior with PCP Dr. Truman Hayward.  Diagnosed with ADHD 15 years ago.  Diagnosed by PCP.  Has never seen psychiatry or counselor for this.   Had issues all through school, but didn't get officially diagnose until age 46.   In the past had tried Focalin, ritalin and adderall.  Mother had ADHD.  Back in school online for project management, 6 week program initially then other self study on the job.  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  Unemployed currently.  He had depressed mood on beta blocker.  He has been on Zoloft in the past and would not be opposed to going back on this  Exercise - was but not currently.  He notes gaining weight with gabapentin prior.  Hypertension - currently taking Verapamil, been on this years.  Prior was on other medicaiton including Losartan HCT but did better on Lisinopril.  Wants to go back on Lisinopril.   Has also been Atenolol chlorthalidone but this caused bad depression.   Has hx/o neuropathy  No prior sleep study.   No snoring.  Does get fatigue, but attributes this to depression.  Been on Zoloft in the past.   Lives with room mate.    He notes spot on back of neck x years.  Was initially small red spot, but now open wound.  Itches, burns, bleeds at times, doesn't seem to want to heal  Has skin lesion on left lower leg x few years.   Has tried ointment that didn't help.    He also has a raised skin lesion on the right abdomen has been there for a while unchanged  No other aggravating or relieving factors.    No other c/o.  Past Medical History:  Diagnosis Date   ADD (attention deficit disorder)    Atrial arrhythmia    Back pain    Depression    Elevated liver enzymes    Esophageal reflux    Family history of colon cancer    Fibromyalgia    GERD (gastroesophageal reflux disease)    HTN (hypertension)    Hyperlipidemia    Hypogonadism in male    IBS (irritable bowel syndrome)    Insomnia    Migraine headache    Osteoarthrosis    Palpitation    Peripheral neuropathy, idiopathic    PSVT (paroxysmal supraventricular tachycardia) (HCC)    RLS (restless legs syndrome)    Current Outpatient Medications on File Prior to Visit  Medication Sig Dispense Refill   Biotin 2500 MCG CAPS Take 1 capsule by mouth daily.     Melatonin 5 MG CHEW Chew 5 mg by mouth at bedtime. 2 tablets at bedtime     meloxicam (MOBIC) 7.5 MG tablet Take 7.5 mg by mouth 2 (two) times daily.  Multiple Vitamin (MULTIVITAMIN) tablet Take 1 tablet by mouth daily.     Nutritional Supplements (LEPTIN MANAGER PO) Take by mouth.     pregabalin (LYRICA) 100 MG capsule Take 1 capsule (100 mg total) by mouth 3 (three) times daily. 90 capsule 5   No current facility-administered medications on file prior to visit.     The following portions of the patient's history were reviewed and updated as appropriate: allergies, current medications, past family history, past medical history, past social history, past surgical history and problem list.  ROS Otherwise as in subjective above    Objective: BP (!) 130/90   Pulse (!) 101   Ht 5\' 10"  (1.778 m)   Wt 277 lb 3.2 oz (125.7 kg)   BMI 39.77 kg/m   BP Readings from Last 3 Encounters:  04/17/22 (!) 130/90  07/19/21 130/90  04/11/21 (!) 151/104   General appearance: alert, no distress,  well developed, well nourished Skin: back of base of neck 3cm diameter horizontally but with a central 8 mm diameter raised erythematous somewhat ulcerated lesion, no fluctuance or warmth.  Right lower abdomen with a triangular shaped 1.5 cm x 2 cm flat red lesion.  Left lower leg with a 1.5 cm round erythematous lesion somewhat crusted rough skin throughout, diffuse pinkish coloration of the right volar foot and between toes suggestive of tinea Neck: supple, no lymphadenopathy, no thyromegaly, no masses Heart: RRR, normal S1, S2, no murmurs Lungs: CTA bilaterally, no wheezes, rhonchi, or rales Pulses: 2+ radial pulses, 2+ pedal pulses, normal cap refill Ext: no edema   Assessment: Encounter Diagnoses  Name Primary?   Primary hypertension Yes   Attention deficit hyperactivity disorder (ADHD), combined type    Impaired fasting blood sugar    Skin lesions    Tinea pedis of both feet    Chronic tachycardia    Other polyneuropathy    High risk medication use    Other fatigue    History of depression       Plan: You have multiple concerns today  High blood pressure Continue verapamil 180 mg daily Begin trial of lisinopril HCT.  You have been on lisinopril in the past so hopefully this will help control blood pressure better  Tachycardia, fast heart rate Limit or avoid caffeine Limit or avoid alcohol Limit or be careful with sugar intake Work on losing weight Consider follow-up with cardiology Continue verapamil for now   ADHD Begin trial of Strattera 40 mg once daily for the first week.  This can be increased to twice daily but you should be able to tell a difference if the medicine is working from morning to afternoon or once daily Lets plan to follow-up in a month on this medication but call sooner if any issues or side effects   Neuropathy Currently under the care of neurology, on Lyrica   Skin lesions You have 3 distinct skin lesions in addition to the foot  fungus I am not sure what to make of the lesion on your leg and the right abdomen yet There is a possibility it could be fungal but it is a little bit more red than typically a fungus rash looks so could be something else well Begin Lotrisone cream topically to the left lower leg skin lesion and the right abdomen skin lesion for no more than 2 weeks Begin oral Diflucan for 1 week I will see you back within the next month If the skin lesions on your leg and abdomen  do not improve you and you may ultimately need to have biopsy or see a surgeon   Foot fungus Begin oral Diflucan once daily for a week Begin topical Lotrisone cream to the affected feet   Prediabetes on labs back in May 2023 Lets plan to recheck some labs next visit fasting\\  Prediabetes means you have a higher than normal blood sugar level. It's not high enough to be considered type 2 diabetes yet, but without making some lifestyle changes you are more likely to develop type 2 diabetes.  If you have prediabetes, the long-term damage of diabetes, especially to your heart, blood vessels and kidneys may already be starting. You may not be able to change certain risk factors such as age, race, or family history, but you CAN make changes to your lifestyle, your eating habits, and your activity.  Although diabetes can develop at any age, the risk of prediabetes increases after age 1.  Your risk of prediabetes increases if you have a parent or sibling with type 2 diabetes.   Although it's unclear why, certain people including Black, Hispanic, American [place] American people, are more likely to develop prediabetes.  Ways to prevent or slow progression to diabetes: Eat healthy foods - Eating red meat and processed meat, and drinking sugar-sweetened beverages, is associated with a higher risk of prediabetes. A diet high in fruits, vegetables, nuts, whole grains and olive oil is associated with a lower risk of prediabetes. Get at  least 150 minutes of moderate aerobic physical activity a week, or about 30 minutes on most days of the week.  The less active you are, the greater your risk of prediabetes. Physical activity helps you control your weight, uses up sugar for energy and makes the body use insulin more effectively. Lose excess weight - Being overweight is a primary risk factor for prediabetes. The more fatty tissue you have, especially inside and between the muscle and skin around your abdomen, the more resistant your cells become to insulin. Waist size. A large waist size can indicate insulin resistance. The risk of insulin resistance goes up for men with waists larger than 40 inches and for women with waists larger than 35 inches. Control your blood pressure and cholesterol.  If your blood pressure is not 130/80 or less, discuss with your provider to help get this under control.  It is ideal to have an HDL good cholesterol number >50 and have a LDL bad cholesterol number <100.   Don't smoke One simple strategy to help you make good food choices and eat appropriate portions sizes is to divide up your plate. These three divisions on your plate promote healthy eating:  One-half: fruit and nonstarchy vegetables One-quarter: whole grains One-quarter: protein-rich foods, such as legumes, fish or lean meats   Jack Lawson was seen today for get established.  Diagnoses and all orders for this visit:  Primary hypertension  Attention deficit hyperactivity disorder (ADHD), combined type  Impaired fasting blood sugar  Skin lesions -     Ambulatory referral to Dermatology  Tinea pedis of both feet  Chronic tachycardia  Other polyneuropathy  High risk medication use  Other fatigue  History of depression  Other orders -     lisinopril-hydrochlorothiazide (ZESTORETIC) 10-12.5 MG tablet; Take 1 tablet by mouth daily. -     fluconazole (DIFLUCAN) 100 MG tablet; Take 1 tablet (100 mg total) by mouth daily. -      atomoxetine (STRATTERA) 25 MG capsule; Take 1 capsule (25  mg total) by mouth 2 (two) times daily with a meal. -     clotrimazole-betamethasone (LOTRISONE) cream; Apply 1 Application topically daily. -     verapamil (CALAN-SR) 180 MG CR tablet; Take 1 tablet (180 mg total) by mouth at bedtime. -     omeprazole (PRILOSEC) 40 MG capsule; Take 1 capsule (40 mg total) by mouth daily.   Spent > 45 minutes face to face with patient in discussion of symptoms, evaluation, plan and recommendations.    Follow up: 61mo

## 2022-04-22 ENCOUNTER — Telehealth: Payer: Self-pay | Admitting: Medical

## 2022-04-23 DIAGNOSIS — L989 Disorder of the skin and subcutaneous tissue, unspecified: Secondary | ICD-10-CM | POA: Diagnosis not present

## 2022-04-23 DIAGNOSIS — C4441 Basal cell carcinoma of skin of scalp and neck: Secondary | ICD-10-CM | POA: Diagnosis not present

## 2022-04-23 DIAGNOSIS — C44519 Basal cell carcinoma of skin of other part of trunk: Secondary | ICD-10-CM | POA: Diagnosis not present

## 2022-04-23 DIAGNOSIS — D485 Neoplasm of uncertain behavior of skin: Secondary | ICD-10-CM | POA: Diagnosis not present

## 2022-04-29 NOTE — Telephone Encounter (Signed)
error 

## 2022-05-01 DIAGNOSIS — I872 Venous insufficiency (chronic) (peripheral): Secondary | ICD-10-CM | POA: Diagnosis not present

## 2022-05-01 DIAGNOSIS — L080 Pyoderma: Secondary | ICD-10-CM | POA: Diagnosis not present

## 2022-05-01 DIAGNOSIS — L231 Allergic contact dermatitis due to adhesives: Secondary | ICD-10-CM | POA: Diagnosis not present

## 2022-05-10 ENCOUNTER — Encounter: Payer: Self-pay | Admitting: Gastroenterology

## 2022-05-15 DIAGNOSIS — C4441 Basal cell carcinoma of skin of scalp and neck: Secondary | ICD-10-CM | POA: Diagnosis not present

## 2022-05-21 ENCOUNTER — Ambulatory Visit: Payer: 59 | Admitting: Medical

## 2022-05-21 VITALS — BP 110/70 | HR 97 | Wt 274.8 lb

## 2022-05-21 DIAGNOSIS — R5383 Other fatigue: Secondary | ICD-10-CM | POA: Diagnosis not present

## 2022-05-21 DIAGNOSIS — F902 Attention-deficit hyperactivity disorder, combined type: Secondary | ICD-10-CM | POA: Diagnosis not present

## 2022-05-21 DIAGNOSIS — R7301 Impaired fasting glucose: Secondary | ICD-10-CM

## 2022-05-21 DIAGNOSIS — G4700 Insomnia, unspecified: Secondary | ICD-10-CM | POA: Diagnosis not present

## 2022-05-21 DIAGNOSIS — L989 Disorder of the skin and subcutaneous tissue, unspecified: Secondary | ICD-10-CM | POA: Diagnosis not present

## 2022-05-21 DIAGNOSIS — R000 Tachycardia, unspecified: Secondary | ICD-10-CM

## 2022-05-21 DIAGNOSIS — F4323 Adjustment disorder with mixed anxiety and depressed mood: Secondary | ICD-10-CM | POA: Diagnosis not present

## 2022-05-21 DIAGNOSIS — I10 Essential (primary) hypertension: Secondary | ICD-10-CM

## 2022-05-21 MED ORDER — TEMAZEPAM 15 MG PO CAPS: 15.0000 mg | ORAL_CAPSULE | Freq: Every evening | ORAL | 0 refills | Status: DC | PRN

## 2022-05-21 MED ORDER — ATOMOXETINE HCL 60 MG PO CAPS: 60.0000 mg | ORAL_CAPSULE | Freq: Every day | ORAL | 0 refills | Status: DC

## 2022-05-21 MED ORDER — SERTRALINE HCL 25 MG PO TABS: 25.0000 mg | ORAL_TABLET | Freq: Every day | ORAL | 0 refills | Status: DC

## 2022-05-21 NOTE — Progress Notes (Signed)
Subjective:  Jack Lawson is a 57 y.o. male who presents for Chief Complaint  Patient presents with   1 month follow-up    1 month follow-up      Here for recheck.  I saw him end of 03/2022 as a new patient.   Was seeing Dr. Hulen Shouts in [place].     Has seen cardiology in the past, Dr. Bettina Gavia. Dr. Krista Blue, neurologist for neuropathy and migraines  Last visit BP was not at goal.  We started Lisinopril HCT to add to his Verapamil.     Compliant with medicaiton .   Here for recheck on ADHD.  Prior to last visit he had not been on medication for years.  We restarted straterra last visit.  He notes only marginal improvement.  Diagnosed with ADHD 15 years ago.  Diagnosed by PCP.  Has never seen psychiatry or counselor for this.  In the past had tried Focalin, ritalin and adderall.  Mother had ADHD.  Back in school online for project management, 6 week program initially then other self study on the job.  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    Last visit was having concerns about fast heart rate.   Does see improvement since the additional Lisinopril HCT.    Last visit we referred to dermatology, they did biopsies and he was found to have basal cell skin cancer of the abdomen and back of neck lesions.     Last visit we started medication for foot fungus.  Doing much better on cream and oral medication.   Under a lot of stress.  Would like to go back on Zoloft.  Needs help with sleep as well.  Has tried various medication for sleep in the past.  After last visit he saw dermatology and had lesions cut off his back and abdominal that were found to be basal cell cancer.  No other aggravating or relieving factors.    No other c/o.  Past Medical History:  Diagnosis Date   ADD (attention deficit disorder)    Atrial arrhythmia    Back pain    Depression    Elevated liver enzymes    Esophageal reflux    Family history of colon cancer    Fibromyalgia    GERD (gastroesophageal reflux  disease)    HTN (hypertension)    Hyperlipidemia    Hypogonadism in male    IBS (irritable bowel syndrome)    Insomnia    Migraine headache    Osteoarthrosis    Palpitation    Peripheral neuropathy, idiopathic    PSVT (paroxysmal supraventricular tachycardia) (HCC)    RLS (restless legs syndrome)    Current Outpatient Medications on File Prior to Visit  Medication Sig Dispense Refill   Biotin 2500 MCG CAPS Take 1 capsule by mouth daily.     clotrimazole-betamethasone (LOTRISONE) cream Apply 1 Application topically daily. 30 g 0   lisinopril-hydrochlorothiazide (ZESTORETIC) 10-12.5 MG tablet Take 1 tablet by mouth daily. 30 tablet 1   meloxicam (MOBIC) 7.5 MG tablet Take 7.5 mg by mouth 2 (two) times daily.      Multiple Vitamin (MULTIVITAMIN) tablet Take 1 tablet by mouth daily.     Nutritional Supplements (LEPTIN MANAGER PO) Take by mouth.     omeprazole (PRILOSEC) 40 MG capsule Take 1 capsule (40 mg total) by mouth daily. 90 capsule 0   pregabalin (LYRICA) 100 MG capsule Take 1 capsule (100 mg total) by mouth 3 (three) times daily. 90 capsule 5  verapamil (CALAN-SR) 180 MG CR tablet Take 1 tablet (180 mg total) by mouth at bedtime. 90 tablet 0   No current facility-administered medications on file prior to visit.     The following portions of the patient's history were reviewed and updated as appropriate: allergies, current medications, past family history, past medical history, past social history, past surgical history and problem list.  ROS Otherwise as in subjective above    Objective: BP 110/70   Pulse 97   Wt 274 lb 12.8 oz (124.6 kg)   BMI 39.43 kg/m   BP Readings from Last 3 Encounters:  05/21/22 110/70  04/17/22 (!) 130/90  07/19/21 130/90   General appearance: alert, no distress, well developed, well nourished        Assessment: Encounter Diagnoses  Name Primary?   Primary hypertension Yes   Impaired fasting blood sugar    Skin lesions     Attention deficit hyperactivity disorder (ADHD), combined type    Chronic tachycardia    Other fatigue    Adjustment reaction with anxiety and depression    Insomnia, unspecified type       Plan: High blood pressure Continue verapamil 180 mg daily, and started Lisinopril HCT in 03/2022.  BP improved.  Labs as below  Tachycardia - improved since last visit   ADHD Increase to Strattera 60 mg once daily    F/u 21mo Prediabetes - updated lab screening today   Foot fungus Since last visit he has seen improvement on 1 week of  oral Diflucan and topical Lotrisone cream    Skin lesions - follow up with dermatology as planned, scheduled in June.   Depressed mood, anxiety, sleep concerns - begin back on Zoloft that he has used prior with success. Can use short term temazepam. Discussed risks/benefit of medication.    SMahadwas seen today for 1 month follow-up.  Diagnoses and all orders for this visit:  Primary hypertension -     Hemoglobin A1c -     TSH  Impaired fasting blood sugar -     Comprehensive metabolic panel  Skin lesions  Attention deficit hyperactivity disorder (ADHD), combined type  Chronic tachycardia  Other fatigue  Adjustment reaction with anxiety and depression  Insomnia, unspecified type  Other orders -     atomoxetine (STRATTERA) 60 MG capsule; Take 1 capsule (60 mg total) by mouth daily. -     sertraline (ZOLOFT) 25 MG tablet; Take 1 tablet (25 mg total) by mouth daily. Daily for 2-3 weeks, then 2 tablets daily -     temazepam (RESTORIL) 15 MG capsule; Take 1 capsule (15 mg total) by mouth at bedtime as needed for sleep.  Spent > 30 minutes face to face with patient in discussion of symptoms, evaluation, plan and recommendations.    Follow up: 176mo

## 2022-05-24 LAB — COMPREHENSIVE METABOLIC PANEL
ALT: 42 IU/L (ref 0–44)
AST: 28 IU/L (ref 0–40)
Albumin/Globulin Ratio: 1.8 (ref 1.2–2.2)
Albumin: 4.6 g/dL (ref 3.8–4.9)
Alkaline Phosphatase: 108 IU/L (ref 44–121)
BUN/Creatinine Ratio: 14 (ref 9–20)
BUN: 20 mg/dL (ref 6–24)
Bilirubin Total: 0.8 mg/dL (ref 0.0–1.2)
CO2: 26 mmol/L (ref 20–29)
Calcium: 10.1 mg/dL (ref 8.7–10.2)
Chloride: 100 mmol/L (ref 96–106)
Creatinine, Ser: 1.46 mg/dL — ABNORMAL HIGH (ref 0.76–1.27)
Globulin, Total: 2.6 g/dL (ref 1.5–4.5)
Glucose: 86 mg/dL (ref 70–99)
Potassium: 4.6 mmol/L (ref 3.5–5.2)
Sodium: 141 mmol/L (ref 134–144)
Total Protein: 7.2 g/dL (ref 6.0–8.5)
eGFR: 56 mL/min/{1.73_m2} — ABNORMAL LOW (ref >59)

## 2022-05-24 LAB — HEMOGLOBIN A1C
Est. average glucose Bld gHb Est-mCnc: 111 mg/dL
Hgb A1c MFr Bld: 5.5 % (ref 4.8–5.6)

## 2022-05-24 LAB — TSH: TSH: 1.33 u[IU]/mL (ref 0.450–4.500)

## 2022-05-24 NOTE — Progress Notes (Signed)
Results sent through My Chart  Schedule 6-month physical visit

## 2022-05-29 ENCOUNTER — Telehealth: Payer: Self-pay | Admitting: Medical

## 2022-05-29 NOTE — Telephone Encounter (Signed)
Left vm for pt to call back and schedule cpe.

## 2022-05-29 NOTE — Telephone Encounter (Signed)
-----   Message from Carlena Hurl, PA-C sent at 05/24/2022  7:30 AM EST ----- Results sent through My Chart  Schedule 69-month physical visit

## 2022-06-05 DIAGNOSIS — C44519 Basal cell carcinoma of skin of other part of trunk: Secondary | ICD-10-CM | POA: Diagnosis not present

## 2022-06-20 ENCOUNTER — Other Ambulatory Visit: Payer: Self-pay | Admitting: Medical

## 2022-07-20 ENCOUNTER — Other Ambulatory Visit: Payer: Self-pay | Admitting: Medical

## 2022-08-14 ENCOUNTER — Other Ambulatory Visit (HOSPITAL_COMMUNITY): Payer: Self-pay

## 2022-08-14 ENCOUNTER — Telehealth: Payer: Self-pay

## 2022-08-14 NOTE — Telephone Encounter (Signed)
Pharmacy Patient Advocate Encounter   Received notification from [facility] that prior authorization for Pregabalin 100MG  capsules is required/requested.   PA submitted on 08/14/2022 to (ins) [facility] via CoverMyMeds Key or [facility]) confirmation # [id number]  Status is pending

## 2022-08-15 ENCOUNTER — Other Ambulatory Visit (HOSPITAL_COMMUNITY): Payer: Self-pay

## 2022-08-15 NOTE — Telephone Encounter (Signed)
Pharmacy Patient Advocate Encounter  Prior Authorization for Pregabalin 100MG  capsules has been approved by [employer] (ins).    PA # PA Case ID #: [id number] Effective dates: 08/14/2022 through 08/13/2023  Copay is $6.19 per [facility] test claim.

## 2022-08-21 ENCOUNTER — Other Ambulatory Visit: Payer: Self-pay | Admitting: Medical

## 2022-12-24 ENCOUNTER — Encounter: Payer: Self-pay | Admitting: Internal Medicine

## 2022-12-24 ENCOUNTER — Other Ambulatory Visit: Payer: Self-pay | Admitting: Medical

## 2022-12-24 NOTE — Telephone Encounter (Signed)
Called and left a message for pt to call back to schedule a CPE

## 2023-03-03 ENCOUNTER — Telehealth: Payer: Self-pay | Admitting: Neurology

## 2023-03-03 NOTE — Telephone Encounter (Signed)
Pt called to schedule a year f/u and to discuss chronic migraine he has had for about a week.

## 2023-03-04 ENCOUNTER — Ambulatory Visit: Payer: 59 | Admitting: Neurology

## 2023-03-04 DIAGNOSIS — G43709 Chronic migraine without aura, not intractable, without status migrainosus: Secondary | ICD-10-CM | POA: Diagnosis not present

## 2023-03-04 DIAGNOSIS — G6289 Other specified polyneuropathies: Secondary | ICD-10-CM | POA: Diagnosis not present

## 2023-03-04 DIAGNOSIS — G2581 Restless legs syndrome: Secondary | ICD-10-CM | POA: Diagnosis not present

## 2023-03-04 MED ORDER — DULOXETINE HCL 60 MG PO CPEP: 60.0000 mg | ORAL_CAPSULE | Freq: Every day | ORAL | 11 refills | Status: DC

## 2023-03-04 MED ORDER — RIZATRIPTAN BENZOATE 10 MG PO TBDP: ORAL_TABLET | ORAL | 6 refills | Status: DC

## 2023-03-04 NOTE — Progress Notes (Signed)
Chief Complaint  Patient presents with   Follow-up    Rm14, alone, Rls:not bothersome to pt. Migraine: same one ongoing for 30 days, triggers: unidentifiable, pt stated that he has neuropathy: bilateral feet w/numbness, burning, tingly, & sharp stabbing pain. Pt is moving and would like rx's printed      ASSESSMENT AND PLAN  Jack Lawson is a 57 y.o. male   Progressive gait abnormality Bilateral lower extremity paresthesia,  Restless leg syndrome     On examination, he has  length dependent sensory changes, loss of distal hairs, toes discoloration, brisk reflex of bilateral patella, probable bilateral Babinski signs, wide-based stiff, unsteady gait  Differentiation diagnosis include cervical myelopathy, with superimposed peripheral neuropathy  Laboratory evaluation showed normal negative HIV, RPR, B12, folic acid, TSH, CPK, ESR, ANA, protein electrophoresis, mild elevation of A1c 6.0  MRI of cervical spine pending  Try Cymbalta 60 mg daily, good response in the past but has  Persistent migraine headaches,  He wants more evaluation, MRI of brain  Hope to Improve  with Cymbalta or Lyrica as preventive medication  Maxalt as needed.    DIAGNOSTIC DATA (LABS, IMAGING, TESTING) - I reviewed patient records, labs, notes, testing and imaging myself where available.   HISTORICAL  Jack Lawson, is of 57 year old male, seen in request by his primary care physician Dr. Nedra Hai, [place], for evaluation of gait abnormality, lower extremity paresthesia, initial evaluation was on Jul 28, 2020.  I reviewed and summarized the referring note.  Past medical history Hypertension Restless leg syndrome Obesity, Depression  Patient reported gradual decline in functional status since 2020, starting from rapid weight gain, then noticed intermittent shooting pain at toes, gradually getting worse, more frequent, more severe, was diagnosed with peripheral neuropathy, started on gabapentin, which  has helped his lower extremity symptoms, but he reported more rapid weight gain, lower extremity swelling,  He began to notice gradual onset mildly unsteady gait, has worked out 6 months persistently for a while, did not improve his stamina, he complains of excessive fatigue, sleepiness during the day, also complains of frequent awakening at nighttime, gasping for air, was recently referred by primary care to ENT for possible acid reflux  He was started on Mirapex 0.5 mg at bedtime for restless leg symptoms, really has helped his sleeping, along with melatonin  He denies bowel and bladder incontinence,  UPDATE Apr 11 2021: He came in today complains of a week history of moderate to severe headaches, more towards the left side, had long history of migraine headaches, previously gabapentin serve as a migraine prevention, this time he has tried multiple dose of NSAIDs, including clonazepam without helping his migraine  UPDATE Jul 19 2021: Complains of worsening bilateral lower extremity pain, gait abnormality, worsening, he denies neck pain, no bilateral upper extremity paresthesia or weakness,  He also has significant bilateral foot pain, radiating pain from feet to leg, he denies incontinence,  His headache has much improved with Lyrica treatment  UPDATE Mar 04 2023: He reported a lot of stress, unemployed for 2 years, just sold his house, is going to moved in with his father, He complains of headache for 4 weeks, daily variable degree behind his eyes, tried Aleve did not help, previous Imitrex did not help his headache  He also complains of significant bilateral foot pain with weightbearing, sedentary most of the time    PHYSICAL EXAM:   Vitals:   03/04/23 1002  BP: (!) 164/111  Pulse: 100  Weight:  277 lb (125.6 kg)  Height: 5\' 10"  (1.778 m)     Body mass index is 39.75 kg/m.  PHYSICAL EXAMNIATION:  Gen: NAD, conversant, well nourised, well groomed            NEUROLOGICAL  EXAM:  MENTAL STATUS: Morbid obesity, central obesity, depressed looking middle-age male, oriented to history taking care of conversation   CRANIAL NERVES: CN II: Visual fields are full to confrontation. Pupils are round equal and briskly reactive to light. CN III, IV, VI: extraocular movement are normal. No ptosis. CN V: Facial sensation is intact to light touch CN VII: Face is symmetric with normal eye closure  CN VIII: Hearing is normal to causal conversation. CN IX, X: Phonation is normal. CN XI: Head turning and shoulder shrug are intact CN XII: Narrow oropharyngeal  MOTOR: There is no pronator drift of out-stretched arms. Muscle bulk and tone are normal. Muscle strength is normal.  REFLEXES: Reflexes are 2+ and symmetric at the biceps, triceps, 3/3 at knees, and absent at ankles. Plantar responses are extensor bilaterally  SENSORY: Mildly length-dependent sensory changes  COORDINATION: There is no trunk or limb dysmetria noted.  GAIT/STANCE: He can get up from seated position arm crossed, cautious  REVIEW OF SYSTEMS:  Full 14 system review of systems performed and notable only for as above All other review of systems were negative.   ALLERGIES: Allergies  Allergen Reactions   Ambien [Zolpidem]     hallucinations   Latex    Codeine Nausea And Vomiting    HOME MEDICATIONS: Current Outpatient Medications  Medication Sig Dispense Refill   clotrimazole-betamethasone (LOTRISONE) cream Apply 1 Application topically daily. 30 g 0   lisinopril-hydrochlorothiazide (ZESTORETIC) 10-12.5 MG tablet TAKE ONE TABLET BY MOUTH EVERY DAY 30 tablet 5   Multiple Vitamin (MULTIVITAMIN) tablet Take 1 tablet by mouth daily.     Nutritional Supplements (LEPTIN MANAGER PO) Take by mouth.     omeprazole (PRILOSEC) 40 MG capsule TAKE 1 CAPSULE BY MOUTH EVERY DAY 90 capsule 1   pregabalin (LYRICA) 100 MG capsule Take 1 capsule (100 mg total) by mouth 3 (three) times daily. 90 capsule 5    temazepam (RESTORIL) 15 MG capsule Take 1 capsule (15 mg total) by mouth at bedtime as needed for sleep. 20 capsule 0   verapamil (CALAN-SR) 180 MG CR tablet TAKE ONE TABLET BY MOUTH AT BEDTIME 90 tablet 0   No current facility-administered medications for this visit.    PAST MEDICAL HISTORY: Past Medical History:  Diagnosis Date   ADD (attention deficit disorder)    Atrial arrhythmia    Back pain    Depression    Elevated liver enzymes    Esophageal reflux    Family history of colon cancer    Fibromyalgia    GERD (gastroesophageal reflux disease)    HTN (hypertension)    Hyperlipidemia    Hypogonadism in male    IBS (irritable bowel syndrome)    Insomnia    Migraine headache    Osteoarthrosis    Palpitation    Peripheral neuropathy, idiopathic    PSVT (paroxysmal supraventricular tachycardia) (HCC)    RLS (restless legs syndrome)     PAST SURGICAL HISTORY: Past Surgical History:  Procedure Laterality Date   COLONOSCOPY  08/10/2009   Mild sigmoid diverticulosis. Otherwise normal colonoscopy to terminal ileum   COLONOSCOPY  04/24/2017   Colonic polyp status post polypectomy. Moderate predominantly sigmoid diverticulosis.    LIPOMA RESECTION  FAMILY HISTORY: Family History  Problem Relation Age of Onset   Deep vein thrombosis Mother    Pulmonary embolism Mother    Hypertension Mother    CVA Mother    Deep vein thrombosis Maternal Aunt    Colon cancer Maternal Aunt    Breast cancer Paternal Aunt    Colon polyps Father     SOCIAL HISTORY: Social History   Socioeconomic History   Marital status: Significant Other    Spouse name: Not on file   Number of children: 0   Years of education: 14   Highest education level: Not on file  Occupational History   Occupation: Engineer, civil (consulting)  Tobacco Use   Smoking status: Never   Smokeless tobacco: Never  Vaping Use   Vaping status: Never Used  Substance and Sexual Activity   Alcohol use: Yes    Comment:  rare   Drug use: Never   Sexual activity: Not on file  Other Topics Concern   Not on file  Social History Narrative   Lives w/ roommate   Caffeine use: none   Right handed    Social Drivers of Corporate investment banker Strain: Not on file  Food Insecurity: Not on file  Transportation Needs: Not on file  Physical Activity: Not on file  Stress: Not on file  Social Connections: Not on file  Intimate Partner Violence: Not on file      Levert Feinstein, M.D. Ph.D.  [facility] [address], [address] [place], [place] [address]  Fax: [phone]  CC:  Tysinger, Kermit Balo, PA-C [address] [place],  [place] [address]  Tysinger, Kermit Balo, PA-C

## 2023-03-11 ENCOUNTER — Telehealth: Payer: Self-pay | Admitting: Neurology

## 2023-03-11 NOTE — Telephone Encounter (Signed)
sent to [facility] they obtain [employer] auth [phone] 

## 2023-03-26 ENCOUNTER — Encounter: Payer: Self-pay | Admitting: Neurology

## 2023-04-01 ENCOUNTER — Ambulatory Visit
Admission: RE | Admit: 2023-04-01 | Discharge: 2023-04-01 | Disposition: A | Discharge status: Home / Self Care | Payer: Self-pay | Source: Ambulatory Visit | Attending: Neurology | Admitting: Neurology

## 2023-04-01 DIAGNOSIS — G6289 Other specified polyneuropathies: Secondary | ICD-10-CM | POA: Diagnosis not present

## 2023-04-01 DIAGNOSIS — G2581 Restless legs syndrome: Secondary | ICD-10-CM

## 2023-04-01 DIAGNOSIS — G43709 Chronic migraine without aura, not intractable, without status migrainosus: Secondary | ICD-10-CM

## 2023-04-07 ENCOUNTER — Encounter: Payer: Self-pay | Admitting: Neurology

## 2023-04-07 ENCOUNTER — Other Ambulatory Visit: Payer: Self-pay | Admitting: Neurology

## 2023-04-07 ENCOUNTER — Telehealth: Payer: Self-pay | Admitting: Neurology

## 2023-04-07 DIAGNOSIS — G2581 Restless legs syndrome: Secondary | ICD-10-CM

## 2023-04-07 DIAGNOSIS — G6289 Other specified polyneuropathies: Secondary | ICD-10-CM

## 2023-04-07 MED ORDER — ONDANSETRON 4 MG PO TBDP: 4.0000 mg | ORAL_TABLET | Freq: Three times a day (TID) | ORAL | 0 refills | Status: DC | PRN

## 2023-04-07 MED ORDER — TIZANIDINE HCL 4 MG PO TABS: 4.0000 mg | ORAL_TABLET | Freq: Four times a day (QID) | ORAL | 0 refills | Status: AC | PRN

## 2023-04-07 MED ORDER — AIMOVIG 70 MG/ML ~~LOC~~ SOAJ
70.0000 mg | SUBCUTANEOUS | 11 refills | Status: DC
Start: 2023-04-07 — End: 2023-05-13

## 2023-04-07 MED ORDER — NURTEC 75 MG PO TBDP: ORAL_TABLET | ORAL | 11 refills | Status: DC

## 2023-04-07 NOTE — Telephone Encounter (Signed)
Returned call to pt, relayed results, pt stated had migraine daily and wanted to know if small vessel dz would be a contributing factor to the migraines. Also running out of maxalt and taking more than the allowed 12 tabs q30 days. Could be causing rebound migraines. Pt really would like to try something else for migraines to get it under control. Routing to Dr. Terrace Arabia

## 2023-04-07 NOTE — Telephone Encounter (Signed)
 Unable to lmtrc 1st attempt

## 2023-04-07 NOTE — Telephone Encounter (Signed)
Pt has returned call to CMA, please call pt back.

## 2023-04-07 NOTE — Telephone Encounter (Signed)
Please call patient, MRI of the brain showed only mild small vessel disease, MRI of cervical spine showed mild multilevel degenerative changes no evidence of spinal cord compression  I have ordered EMG nerve conduction study for further evaluation      IMPRESSION: Unremarkable MRI scan of the brain without contrast showing only mild age-related changes of chronic small vessel disease and incidental changes of chronic paranasal sinusitis.   IMPRESSION: MRI scan cervical spine without contrast showing spondylitic changes at C3-4 and C4-5 with there is mild right greater than left foraminal narrowing but no definite compression.

## 2023-04-07 NOTE — Telephone Encounter (Signed)
Meds ordered this encounter  Medications   Erenumab-aooe (AIMOVIG) 70 MG/ML SOAJ    Sig: Inject 70 mg into the skin every 30 (thirty) days.    Dispense:  1.12 mL    Refill:  11   ondansetron (ZOFRAN-ODT) 4 MG disintegrating tablet    Sig: Take 1 tablet (4 mg total) by mouth every 8 (eight) hours as needed for nausea or vomiting.    Dispense:  20 tablet    Refill:  0   tiZANidine (ZANAFLEX) 4 MG tablet    Sig: Take 1 tablet (4 mg total) by mouth every 6 (six) hours as needed for muscle spasms.    Dispense:  30 tablet    Refill:  0   Rimegepant Sulfate (NURTEC) 75 MG TBDP    Sig: Take 1 tab at onset of migraine.  May repeat in 2 hrs, if needed.  Max dose: 2 tabs/day. This is a 30 day prescription.    Dispense:  12 tablet    Refill:  11      I called patient, he has frequent headaches, taking medications daily, either over-the-counter Aleve Tylenol or the prescription of Maxalt without helping his headache much  Also on Cymbalta 60 mg daily  Aimovig as a preventive medication Nurtec as needed, may combine with Zofran tizanidine NSAIDs for severe prolonged headaches,

## 2023-04-07 NOTE — Addendum Note (Signed)
Addended by: Levert Feinstein on: 04/07/2023 02:28 PM   Modules accepted: Orders

## 2023-04-14 ENCOUNTER — Telehealth: Payer: Self-pay | Admitting: Medical

## 2023-04-14 MED ORDER — VERAPAMIL HCL ER 180 MG PO TBCR: 180.0000 mg | EXTENDED_RELEASE_TABLET | Freq: Every day | ORAL | 0 refills | Status: DC

## 2023-04-14 MED ORDER — OMEPRAZOLE 40 MG PO CPDR: 40.0000 mg | DELAYED_RELEASE_CAPSULE | Freq: Every day | ORAL | 0 refills | Status: DC

## 2023-04-14 MED ORDER — LISINOPRIL-HYDROCHLOROTHIAZIDE 10-12.5 MG PO TABS: 1.0000 | ORAL_TABLET | Freq: Every day | ORAL | 0 refills | Status: DC

## 2023-04-14 NOTE — Telephone Encounter (Signed)
Pt called and scheduled a cpe for 05/13/23. He is needing a refill on verapamil, lisinopril, and omeprazole to  [id number] [place] [place] - [address]  If this cannot be filled at this time, he would like a call.

## 2023-04-14 NOTE — Telephone Encounter (Signed)
Refilled for 30 days

## 2023-04-15 ENCOUNTER — Other Ambulatory Visit (HOSPITAL_COMMUNITY): Payer: Self-pay

## 2023-04-15 NOTE — Telephone Encounter (Signed)
No PA needed- coverage limit of 18 tablets per 21 days

## 2023-04-17 ENCOUNTER — Other Ambulatory Visit (HOSPITAL_COMMUNITY): Payer: Self-pay

## 2023-04-17 NOTE — Telephone Encounter (Signed)
Please see test billing results below. PA is not needed. Pt insurance will only cover 18 tablets per 21 days. Pharmacy will need to change quantity to 18 tablets instead of 20. If they bill for 20 it will not go through.

## 2023-04-21 NOTE — Telephone Encounter (Signed)
Patient called stated the insurance is having problems with three of the medications and wanted to know what is going on.

## 2023-04-23 ENCOUNTER — Other Ambulatory Visit (HOSPITAL_COMMUNITY): Payer: Self-pay

## 2023-04-23 ENCOUNTER — Telehealth: Payer: Self-pay

## 2023-04-23 NOTE — Telephone Encounter (Signed)
*  [facility]  Pharmacy Patient Advocate Encounter   Received notification from RX Request Messages that prior authorization for Nurtec 75MG  dispersible tablets  is required/requested.   Insurance verification completed.   The patient is insured through [facility] .   Per test claim: PA required; PA submitted to above mentioned insurance via CoverMyMeds Key/confirmation #/EOC [id number] Status is pending

## 2023-04-23 NOTE — Telephone Encounter (Signed)
 PA request has been Submitted. New Encounter created for follow up. For additional info see Pharmacy Prior Auth telephone encounter from 02/05.

## 2023-04-24 ENCOUNTER — Other Ambulatory Visit (HOSPITAL_COMMUNITY): Payer: Self-pay

## 2023-04-24 NOTE — Telephone Encounter (Signed)
 Pharmacy Patient Advocate Encounter  Received notification from [facility] that Prior Authorization for Nurtec 75MG  dispersible tablets has been APPROVED from 04/23/23 to 04/21/2024. Ran test claim, Copay is $1,491.11. This test claim was processed through [facility]- copay amounts may vary at other pharmacies due to pharmacy/plan contracts, or as the patient moves through the different stages of their insurance plan.   PA #/Case ID/Reference #: PA Case ID #: [id number]

## 2023-04-28 ENCOUNTER — Telehealth: Payer: Self-pay

## 2023-04-28 NOTE — Telephone Encounter (Signed)
 I left the patient a voicemail to reschedule his appointment from 05/28/2023 to before 05/23/2023.

## 2023-04-30 ENCOUNTER — Other Ambulatory Visit: Payer: Self-pay

## 2023-04-30 MED ORDER — NURTEC 75 MG PO TBDP: 1.0000 | ORAL_TABLET | ORAL | 3 refills | Status: AC

## 2023-04-30 NOTE — Progress Notes (Signed)
Instructions updated on nurtec

## 2023-04-30 NOTE — Telephone Encounter (Signed)
PA request has been Approved. New Encounter created for follow up. For additional info see Pharmacy Prior Auth telephone encounter from 02/05.

## 2023-05-07 ENCOUNTER — Other Ambulatory Visit: Payer: Self-pay | Admitting: Medical

## 2023-05-12 ENCOUNTER — Encounter: Payer: 59 | Admitting: Neurology

## 2023-05-12 NOTE — Telephone Encounter (Signed)
 No need to reschedule,

## 2023-05-13 ENCOUNTER — Ambulatory Visit: Payer: 59 | Admitting: Medical

## 2023-05-13 ENCOUNTER — Encounter: Payer: Self-pay | Admitting: Medical

## 2023-05-13 VITALS — BP 130/80 | HR 101 | Ht 70.0 in | Wt 257.8 lb

## 2023-05-13 DIAGNOSIS — Z13220 Encounter for screening for lipoid disorders: Secondary | ICD-10-CM

## 2023-05-13 DIAGNOSIS — R7989 Other specified abnormal findings of blood chemistry: Secondary | ICD-10-CM

## 2023-05-13 DIAGNOSIS — R350 Frequency of micturition: Secondary | ICD-10-CM

## 2023-05-13 DIAGNOSIS — Z23 Encounter for immunization: Secondary | ICD-10-CM | POA: Diagnosis not present

## 2023-05-13 DIAGNOSIS — Z0000 Encounter for general adult medical examination without abnormal findings: Secondary | ICD-10-CM

## 2023-05-13 DIAGNOSIS — Z7185 Encounter for immunization safety counseling: Secondary | ICD-10-CM

## 2023-05-13 DIAGNOSIS — Z1211 Encounter for screening for malignant neoplasm of colon: Secondary | ICD-10-CM

## 2023-05-13 DIAGNOSIS — Z125 Encounter for screening for malignant neoplasm of prostate: Secondary | ICD-10-CM

## 2023-05-13 NOTE — Progress Notes (Signed)
 Subjective:   HPI  Jack Lawson is a 58 y.o. male who presents for Chief Complaint  Patient presents with   Annual Exam    Fasting annual exam. Having to wake up at night to urinate.     Patient Care Team: Arieana Somoza, Kermit Balo, PA-C as PCP - General (Family Medicine) Wilder Glade, MD as Consulting Physician (Family Medicine) Dr. Lynann Bologna, GI Dermatology, Tollie Eth, [facility] Dermatology Dr. Levert Feinstein, neurology   Concerns: At night when lying down hears and feels a ticking sound in his throat.   This has been going on a while.  Wants to see ENT for this.  Needs updated referral to GI for colonoscopy.    He has to get up almost hourly at night to urinate, but no issue in the day time.  Doesn't drink excessive fluids in the evenings.    Reviewed their medical, surgical, family, social, medication, and allergy history and updated chart as appropriate.  Allergies  Allergen Reactions   Ambien [Zolpidem]     hallucinations   Latex    Codeine Nausea And Vomiting    Past Medical History:  Diagnosis Date   ADD (attention deficit disorder)    Atrial arrhythmia    Back pain    Depression    Elevated liver enzymes    Esophageal reflux    Family history of colon cancer    Fibromyalgia    GERD (gastroesophageal reflux disease)    HTN (hypertension)    Hyperlipidemia    Hypogonadism in male    IBS (irritable bowel syndrome)    Insomnia    Migraine headache    Osteoarthrosis    Palpitation    Peripheral neuropathy, idiopathic    PSVT (paroxysmal supraventricular tachycardia) (HCC)    RLS (restless legs syndrome)     Current Outpatient Medications on File Prior to Visit  Medication Sig Dispense Refill   DULoxetine (CYMBALTA) 60 MG capsule Take 1 capsule (60 mg total) by mouth daily. 30 capsule 11   lisinopril-hydrochlorothiazide (ZESTORETIC) 10-12.5 MG tablet Take 1 tablet by mouth daily. 30 tablet 0   Multiple Vitamin (MULTIVITAMIN) tablet Take 1 tablet by  mouth daily.     omeprazole (PRILOSEC) 40 MG capsule Take 1 capsule (40 mg total) by mouth daily. 30 capsule 0   verapamil (CALAN-SR) 180 MG CR tablet Take 1 tablet (180 mg total) by mouth at bedtime. 30 tablet 0   clotrimazole-betamethasone (LOTRISONE) cream Apply 1 Application topically daily. (Patient not taking: Reported on 05/13/2023) 30 g 0   Rimegepant Sulfate (NURTEC) 75 MG TBDP Take 1 tablet (75 mg total) by mouth as directed. Take 1 tablet at onset of migraine. No more than one table in 24 hr period (Patient not taking: Reported on 05/13/2023) 8 tablet 3   tiZANidine (ZANAFLEX) 4 MG tablet Take 1 tablet (4 mg total) by mouth every 6 (six) hours as needed for muscle spasms. (Patient not taking: Reported on 05/13/2023) 30 tablet 0   No current facility-administered medications on file prior to visit.      Current Outpatient Medications:    DULoxetine (CYMBALTA) 60 MG capsule, Take 1 capsule (60 mg total) by mouth daily., Disp: 30 capsule, Rfl: 11   lisinopril-hydrochlorothiazide (ZESTORETIC) 10-12.5 MG tablet, Take 1 tablet by mouth daily., Disp: 30 tablet, Rfl: 0   Multiple Vitamin (MULTIVITAMIN) tablet, Take 1 tablet by mouth daily., Disp: , Rfl:    omeprazole (PRILOSEC) 40 MG capsule, Take 1 capsule (40 mg total)  by mouth daily., Disp: 30 capsule, Rfl: 0   verapamil (CALAN-SR) 180 MG CR tablet, Take 1 tablet (180 mg total) by mouth at bedtime., Disp: 30 tablet, Rfl: 0   clotrimazole-betamethasone (LOTRISONE) cream, Apply 1 Application topically daily. (Patient not taking: Reported on 05/13/2023), Disp: 30 g, Rfl: 0   Rimegepant Sulfate (NURTEC) 75 MG TBDP, Take 1 tablet (75 mg total) by mouth as directed. Take 1 tablet at onset of migraine. No more than one table in 24 hr period (Patient not taking: Reported on 05/13/2023), Disp: 8 tablet, Rfl: 3   tiZANidine (ZANAFLEX) 4 MG tablet, Take 1 tablet (4 mg total) by mouth every 6 (six) hours as needed for muscle spasms. (Patient not taking:  Reported on 05/13/2023), Disp: 30 tablet, Rfl: 0  Family History  Problem Relation Age of Onset   Deep vein thrombosis Mother    Pulmonary embolism Mother    Hypertension Mother    CVA Mother    Deep vein thrombosis Maternal Aunt    Colon cancer Maternal Aunt    Breast cancer Paternal Aunt    Colon polyps Father     Past Surgical History:  Procedure Laterality Date   COLONOSCOPY  08/10/2009   Mild sigmoid diverticulosis. Otherwise normal colonoscopy to terminal ileum   COLONOSCOPY  04/24/2017   Colonic polyp status post polypectomy. Moderate predominantly sigmoid diverticulosis.    LIPOMA RESECTION     SKIN SURGERY  2024   Review of Systems  Constitutional:  Negative for chills, fever, malaise/fatigue and weight loss.  HENT:  Negative for congestion, ear pain, hearing loss, sore throat and tinnitus.   Eyes:  Negative for blurred vision, pain and redness.  Respiratory:  Negative for cough, hemoptysis and shortness of breath.   Cardiovascular:  Negative for chest pain, palpitations, orthopnea, claudication and leg swelling.  Gastrointestinal:  Negative for abdominal pain, blood in stool, constipation, diarrhea, nausea and vomiting.  Genitourinary:  Positive for frequency. Negative for dysuria, flank pain, hematuria and urgency.       Nocturia  Musculoskeletal:  Negative for falls, joint pain and myalgias.  Skin:  Negative for itching and rash.  Neurological:  Negative for dizziness, tingling, speech change, weakness and headaches.  Endo/Heme/Allergies:  Negative for polydipsia. Does not bruise/bleed easily.  Psychiatric/Behavioral:  Negative for depression and memory loss. The patient is not nervous/anxious and does not have insomnia.        Objective:  BP 130/80   Pulse (!) 101   Ht 5\' 10"  (1.778 m)   Wt 257 lb 12.8 oz (116.9 kg)   BMI 36.99 kg/m   Wt Readings from Last 3 Encounters:  05/13/23 257 lb 12.8 oz (116.9 kg)  03/04/23 277 lb (125.6 kg)  05/21/22 274 lb  12.8 oz (124.6 kg)   BP Readings from Last 3 Encounters:  05/13/23 130/80  03/04/23 (!) 164/111  05/21/22 110/70    General appearance: alert, no distress, WD/WN, Caucasian male Skin: scattered macules, no worrisome lesions, linear scar or right abdomen mid abdomen HEENT: normocephalic, conjunctiva/corneas normal, sclerae anicteric, PERRLA, EOMi, nares patent, no discharge or erythema, pharynx normal Oral cavity: MMM, tongue normal, teeth in good repair Neck: supple, no lymphadenopathy, no thyromegaly, no masses, normal ROM, no bruits Chest: non tender, normal shape and expansion Heart: RRR, normal S1, S2, no murmurs Lungs: CTA bilaterally, no wheezes, rhonchi, or rales Abdomen: +bs, soft, non tender, non distended, no masses, no hepatomegaly, no splenomegaly, no bruits Back: non tender, normal ROM, no scoliosis  Musculoskeletal: upper extremities non tender, no obvious deformity, normal ROM throughout, lower extremities non tender, no obvious deformity, normal ROM throughout Extremities: no edema, no cyanosis, no clubbing Pulses: 2+ symmetric, upper and lower extremities, normal cap refill Neurological: alert, oriented x 3, CN2-12 intact, strength normal upper extremities and lower extremities, sensation normal throughout, DTRs 2+ throughout, no cerebellar signs, gait normal Psychiatric: normal affect, behavior normal, pleasant  GU: normal male external genitalia,circumcised, nontender, no masses, no hernia, no lymphadenopathy Rectal: anus normal tone, prostate enlarged mild to moderate, no nodules    Assessment and Plan :   Encounter Diagnoses  Name Primary?   Annual physical exam Yes   Urinary frequency    Need for influenza vaccination    Need for COVID-19 vaccine    Vaccine counseling    Screening for prostate cancer    Screening for colon cancer    Elevated serum creatinine    Screening for lipid disorders     This visit was a preventative care visit, also known as  wellness visit or routine physical.   Topics typically include healthy lifestyle, diet, exercise, preventative care, vaccinations, sick and well care, proper use of emergency dept and after hours care, as well as other concerns.     Separate significant issues discussed: Elevated creatinine - updated labs today  Urinary frequency - labs as below   General Recommendations: Continue to return yearly for your annual wellness and preventative care visits.  This gives [place] a chance to discuss healthy lifestyle, exercise, vaccinations, review your chart record, and perform screenings where appropriate.  I recommend you see your eye doctor yearly for routine vision care.  I recommend you see your dentist yearly for routine dental care including hygiene visits twice yearly.   Vaccination  Immunization History  Administered Date(s) Administered   Influenza, Seasonal, Injecte, Preservative Fre 05/13/2023   Moderna Sars-Covid-2 Vaccination 10/18/2019, 11/15/2019   Pfizer(Comirnaty)Fall Seasonal Vaccine 12 years and older 05/13/2023    Vaccine recommendations: Consider Pneumococcal 21 vaccine, Tdap, and Shingrix vaccines.  You can do them at pharmacy or here.  Vaccines administered today: Counseled on the influenza virus vaccine.  Vaccine information sheet given.  Influenza vaccine given after consent obtained.  Counseled on the Covid virus vaccine.  Vaccine information sheet given.  Covid vaccine given after consent obtained.    Screening for cancer: Colon cancer screening: We will refer you for screening colonoscopy  Prostate Cancer screening: The recommended prostate cancer screening test is a blood test called the prostate-specific antigen (PSA) test. PSA is a protein that is made in the prostate. As you age, your prostate naturally produces more PSA. Abnormally high PSA levels may be caused by: Prostate cancer. An enlarged prostate that is not caused by cancer (benign prostatic  hyperplasia, or BPH). This condition is very common in older men. A prostate gland infection (prostatitis) or urinary tract infection. Certain medicines such as male hormones (like testosterone) or other medicines that raise testosterone levels. A rectal exam may be done as part of prostate cancer screening to help provide information about the size of your prostate gland. When a rectal exam is performed, it should be done after the PSA level is drawn to avoid any effect on the results.   Skin cancer screening: Check your skin regularly for new changes, growing lesions, or other lesions of concern Come in for evaluation if you have skin lesions of concern.   Lung cancer screening: If you have a greater than 20 pack  year history of tobacco use, then you may qualify for lung cancer screening with a chest CT scan.   Please call your insurance company to inquire about coverage for this test.   Pancreatic cancer:  no current screening test is available or routinely recommended. (risk factors: smoking, overweight or obese, diabetes, chronic pancreatitis, work exposure - dry cleaning, metal working, 58yo>, M>F, Tree surgeon, family hx/o, hereditary breast, ovarian, melanoma, lynch, peutz-jeghers).  Symptoms: jaundice, dark urine, light color or greasy stools, itchy skin, belly or back pain, weight loss, poor appetite, nausea, vomiting, liver enlargement, DVT/blood clots.   We currently don't have screenings for other cancers besides breast, cervical, colon, and lung cancers.  If you have a strong family history of cancer or have other cancer screening concerns, please let me know.  Genetic testing referral is an option for individuals with high cancer risk in the family.  There are some other cancer screenings in development currently.   Bone health: Get at least 150 minutes of aerobic exercise weekly Get weight bearing exercise at least once weekly Bone density test:  A bone density test is an  imaging test that uses a type of X-ray to measure the amount of calcium and other minerals in your bones. The test may be used to diagnose or screen you for a condition that causes weak or thin bones (osteoporosis), predict your risk for a broken bone (fracture), or determine how well your osteoporosis treatment is working. The bone density test is recommended for females 65 and older, or females or males <65 if certain risk factors such as thyroid disease, long term use of steroids such as for asthma or rheumatological issues, vitamin D deficiency, estrogen deficiency, family history of osteoporosis, self or family history of fragility fracture in first degree relative.    Heart health: Get at least 150 minutes of aerobic exercise weekly Limit alcohol It is important to maintain a healthy blood pressure and healthy cholesterol numbers  Heart disease screening: Screening for heart disease includes screening for blood pressure, fasting lipids, glucose/diabetes screening, BMI height to weight ratio, reviewed of smoking status, physical activity, and diet.    Goals include blood pressure 120/80 or less, maintaining a healthy lipid/cholesterol profile, preventing diabetes or keeping diabetes numbers under good control, not smoking or using tobacco products, exercising most days per week or at least 150 minutes per week of exercise, and eating healthy variety of fruits and vegetables, healthy oils, and avoiding unhealthy food choices like fried food, fast food, high sugar and high cholesterol foods.    Other tests may possibly include EKG test, CT coronary calcium score, echocardiogram, exercise treadmill stress test.     Consider CT coronary calcium score heart test, $95 cash pay screen   Vascular disease screening: For higher risk individuals including smokers, diabetics, patients with known heart disease or high blood pressure, kidney disease, and others, screening for vascular disease or  atherosclerosis of the arteries is available.  Examples may include carotid ultrasound, abdominal aortic ultrasound, ABI blood flow screening in the legs, thoracic aorta screening.   Medical care options: I recommend you continue to seek care here first for routine care.  We try really hard to have available appointments Monday through Friday daytime hours for sick visits, acute visits, and physicals.  Urgent care should be used for after hours and weekends for significant issues that cannot wait till the next day.  The emergency department should be used for significant potentially life-threatening emergencies.  The  emergency department is expensive, can often have long wait times for less significant concerns, so try to utilize primary care, urgent care, or telemedicine when possible to avoid unnecessary trips to the emergency department.  Virtual visits and telemedicine have been introduced since the pandemic started in 2020, and can be convenient ways to receive medical care.  We offer virtual appointments as well to assist you in a variety of options to seek medical care.   Legal  Take the time to do a last will and testament, Advanced Directives including Health Care Power of Attorney and Living Will documents.  Don't leave your family with burdens that can be handled ahead of time.   Advanced Directives: I recommend you consider completing a Health Care Power of Attorney and Living Will.   These documents respect your wishes and help alleviate burdens on your loved ones if you were to become terminally ill or be in a position to need those documents enforced.    You can complete Advanced Directives yourself, have them notarized, then have copies made for our office, for you and for anybody you feel should have them in safe keeping.  Or, you can have an attorney prepare these documents.   If you haven't updated your Last Will and Testament in a while, it may be worthwhile having an attorney  prepare these documents together and save on some costs.       Spiritual and Emotional Health Keeping a healthy spiritual life can help you better manage your physical health. Your spiritual life can help you to cope with any issues that may arise with your physical health.  Balance can keep [place] healthy and help [place] to recover.  If you are struggling with your spiritual health there are questions that you may want to ask yourself:  What makes me feel most complete? When do I feel most connected to the rest of the world? Where do I find the most inner strength? What am I doing when I feel whole?  Helpful tips: Being in nature. Some people feel very connected and at peace when they are walking outdoors or are outside. Helping others. Some feel the largest sense of wellbeing when they are of service to others. Being of service can take on many forms. It can be doing volunteer work, being kind to strangers, or offering a hand to a friend in need. Gratitude. Some people find they feel the most connected when they remain grateful. They may make lists of all the things they are grateful for or say a thank you out loud for all they have.    Emotional Health Are you in tune with your emotional health?  Check out this link: http://www.marquez-love.com/    Financial Health Make sure you use a budget for your personal finances Make sure you are insured against risks (health insurance, life insurance, auto insurance, etc) Save more, spend less Set financial goals If you need help in this area, good resources include counseling through [employer] or other community resources, have a meeting with a Social research officer, government, and a good resource is the [employer]    Jack Lawson was seen today for annual exam.  Diagnoses and all orders for this visit:  Annual physical exam -     Cancel: POCT Urinalysis DIP (Proadvantage Device) -     Renal Function Panel -     Hepatic function  panel -     Lipid panel -     PSA -  CBC  Urinary frequency -     Cancel: POCT Urinalysis DIP (Proadvantage Device)  Need for influenza vaccination -     Flu vaccine trivalent PF, 6mos and older(Flulaval,Afluria,Fluarix,Fluzone)  Need for COVID-19 vaccine -     Pfizer Comirnaty Covid -19 Vaccine 38yrs and older  Vaccine counseling  Screening for prostate cancer -     PSA  Screening for colon cancer -     Ambulatory referral to Gastroenterology  Elevated serum creatinine -     Renal Function Panel  Screening for lipid disorders -     Lipid panel     Follow-up pending labs, yearly for physical

## 2023-05-14 ENCOUNTER — Other Ambulatory Visit: Payer: Self-pay | Admitting: Medical

## 2023-05-14 LAB — CBC
Hematocrit: 49.9 % (ref 37.5–51.0)
Hemoglobin: 17 g/dL (ref 13.0–17.7)
MCH: 30 pg (ref 26.6–33.0)
MCHC: 34.1 g/dL (ref 31.5–35.7)
MCV: 88 fL (ref 79–97)
Platelets: 247 10*3/uL (ref 150–450)
RBC: 5.67 x10E6/uL (ref 4.14–5.80)
RDW: 12.1 % (ref 11.6–15.4)
WBC: 8.6 10*3/uL (ref 3.4–10.8)

## 2023-05-14 LAB — RENAL FUNCTION PANEL
Albumin: 5 g/dL — ABNORMAL HIGH (ref 3.8–4.9)
BUN/Creatinine Ratio: 12 (ref 9–20)
BUN: 17 mg/dL (ref 6–24)
CO2: 26 mmol/L (ref 20–29)
Calcium: 9.6 mg/dL (ref 8.7–10.2)
Chloride: 98 mmol/L (ref 96–106)
Creatinine, Ser: 1.43 mg/dL — ABNORMAL HIGH (ref 0.76–1.27)
Glucose: 84 mg/dL (ref 70–99)
Phosphorus: 2.8 mg/dL (ref 2.8–4.1)
Potassium: 4.1 mmol/L (ref 3.5–5.2)
Sodium: 139 mmol/L (ref 134–144)
eGFR: 58 mL/min/{1.73_m2} — ABNORMAL LOW (ref >59)

## 2023-05-14 LAB — LIPID PANEL
Chol/HDL Ratio: 3.9 ratio (ref 0.0–5.0)
Cholesterol, Total: 211 mg/dL — ABNORMAL HIGH (ref 100–199)
HDL: 54 mg/dL (ref >39)
LDL Chol Calc (NIH): 133 mg/dL — ABNORMAL HIGH (ref 0–99)
Triglycerides: 137 mg/dL (ref 0–149)
VLDL Cholesterol Cal: 24 mg/dL (ref 5–40)

## 2023-05-14 LAB — HEPATIC FUNCTION PANEL
ALT: 37 IU/L (ref 0–44)
AST: 28 IU/L (ref 0–40)
Alkaline Phosphatase: 95 IU/L (ref 44–121)
Bilirubin Total: 0.9 mg/dL (ref 0.0–1.2)
Bilirubin, Direct: 0.3 mg/dL (ref 0.00–0.40)
Total Protein: 7.4 g/dL (ref 6.0–8.5)

## 2023-05-14 LAB — PSA: Prostate Specific Ag, Serum: 2 ng/mL (ref 0.0–4.0)

## 2023-05-14 MED ORDER — VERAPAMIL HCL ER 180 MG PO TBCR
180.0000 mg | EXTENDED_RELEASE_TABLET | Freq: Every day | ORAL | 2 refills | Status: AC
Start: 2023-05-14 — End: ?

## 2023-05-14 MED ORDER — OMEPRAZOLE 40 MG PO CPDR: 40.0000 mg | DELAYED_RELEASE_CAPSULE | Freq: Every day | ORAL | 2 refills | Status: AC

## 2023-05-14 MED ORDER — LISINOPRIL-HYDROCHLOROTHIAZIDE 10-12.5 MG PO TABS
1.0000 | ORAL_TABLET | Freq: Every day | ORAL | 2 refills | Status: AC
Start: 2023-05-14 — End: ?

## 2023-05-14 NOTE — Progress Notes (Signed)
 Results sent through MyChart

## 2023-05-28 ENCOUNTER — Encounter: Payer: 59 | Admitting: Neurology

## 2023-09-16 ENCOUNTER — Ambulatory Visit: Admitting: Medical

## 2023-09-16 ENCOUNTER — Ambulatory Visit: Admitting: Family Medicine

## 2023-09-16 ENCOUNTER — Encounter: Payer: Self-pay | Admitting: Family Medicine

## 2023-09-16 VITALS — BP 136/90 | HR 108 | Wt 255.8 lb

## 2023-09-16 DIAGNOSIS — G5601 Carpal tunnel syndrome, right upper limb: Secondary | ICD-10-CM

## 2023-09-16 DIAGNOSIS — I10 Essential (primary) hypertension: Secondary | ICD-10-CM | POA: Diagnosis not present

## 2023-09-16 DIAGNOSIS — N183 Chronic kidney disease, stage 3 unspecified: Secondary | ICD-10-CM

## 2023-09-16 NOTE — Progress Notes (Signed)
   Subjective:    Patient ID: Jack Lawson, male    DOB: Mar 13, 1966, 58 y.o.   MRN: [id number]  HPI He is here to discuss several issues.  He has concerns about his blood pressure.  He also has had an issue of pressure behind his eyes and then subsequently a headache but the headache is not unilateral in nature.  He was also told by neurology that he has small vessel disease.  He also was told he had CKD 3 and wants more information concerning that.  He also has a history of carpal tunnel and would like a referral.  He states that he has had difficulty with CKD for several years and was told by several people that he has it.  He does use a night splint.   Review of Systems     Objective:    Physical Exam Alert and in no distress otherwise not examined       Assessment & Plan:  Stage 3 chronic kidney disease, unspecified whether stage 3a or 3b CKD (HCC)  Primary hypertension  Carpal tunnel syndrome of right wrist I explained CKD with him and the fact that this is a very common diagnosis but does not necessarily indicate total renal failure and dialysis.  Encouraged him to drink plenty of fluids as his number was 58.  Explained that we will continue to monitor this. I then discussed the blood pressure issue with him.  Explained that the best way to handle this would be for him to get a blood pressure cuff and bring it in so we can measure it against ours and explained that the technique on the how to take the blood pressure.  Also discussed the fact that taking it too often can give too much information is really not necessary. Since he has had the carpal tunnel for a long time, I will set him up to see Dr. Vita for further evaluation and treatment.  He was comfortable with that.

## 2023-09-22 ENCOUNTER — Encounter: Payer: Self-pay | Admitting: Family Medicine

## 2023-10-03 ENCOUNTER — Telehealth: Payer: Self-pay | Admitting: Internal Medicine

## 2023-10-03 NOTE — Telephone Encounter (Signed)
 Copied from CRM [id number]. Topic: General - Other >> Sep 26, 2023  9:34 AM Graeme ORN wrote: Reason for CRM: Patient called. Has questions about letter he received stating he is fired from [employer]. He was only supposed to be changing to new provider. Already had appt scheduled but it was canceled. Would like a call back with more information. Thank You

## 2023-10-07 NOTE — Telephone Encounter (Signed)
 I spoke with patient and explained everything I was told.  He was not happy and said everything was blown out of proportion and he would not be back.

## 2023-10-21 ENCOUNTER — Encounter: Admitting: Family Medicine

## 2023-11-10 ENCOUNTER — Other Ambulatory Visit: Payer: Self-pay | Admitting: Medical

## 2023-11-10 DIAGNOSIS — G629 Polyneuropathy, unspecified: Secondary | ICD-10-CM | POA: Diagnosis not present

## 2023-11-10 DIAGNOSIS — R000 Tachycardia, unspecified: Secondary | ICD-10-CM | POA: Diagnosis not present

## 2023-11-10 DIAGNOSIS — I129 Hypertensive chronic kidney disease with stage 1 through stage 4 chronic kidney disease, or unspecified chronic kidney disease: Secondary | ICD-10-CM | POA: Diagnosis not present

## 2023-11-10 DIAGNOSIS — G43709 Chronic migraine without aura, not intractable, without status migrainosus: Secondary | ICD-10-CM | POA: Diagnosis not present

## 2023-11-10 DIAGNOSIS — G2581 Restless legs syndrome: Secondary | ICD-10-CM | POA: Diagnosis not present

## 2023-11-10 DIAGNOSIS — I7789 Other specified disorders of arteries and arterioles: Secondary | ICD-10-CM | POA: Diagnosis not present

## 2023-11-10 DIAGNOSIS — R002 Palpitations: Secondary | ICD-10-CM | POA: Diagnosis not present

## 2023-11-10 DIAGNOSIS — R7301 Impaired fasting glucose: Secondary | ICD-10-CM | POA: Diagnosis not present

## 2023-11-10 DIAGNOSIS — Z6836 Body mass index (BMI) 36.0-36.9, adult: Secondary | ICD-10-CM | POA: Diagnosis not present

## 2023-11-10 DIAGNOSIS — N1831 Chronic kidney disease, stage 3a: Secondary | ICD-10-CM | POA: Diagnosis not present

## 2024-01-13 DIAGNOSIS — G2581 Restless legs syndrome: Secondary | ICD-10-CM | POA: Diagnosis not present

## 2024-01-13 DIAGNOSIS — G4736 Sleep related hypoventilation in conditions classified elsewhere: Secondary | ICD-10-CM | POA: Diagnosis not present

## 2024-01-13 DIAGNOSIS — Z6837 Body mass index (BMI) 37.0-37.9, adult: Secondary | ICD-10-CM | POA: Diagnosis not present

## 2024-01-13 DIAGNOSIS — G4733 Obstructive sleep apnea (adult) (pediatric): Secondary | ICD-10-CM | POA: Diagnosis not present

## 2024-01-19 DIAGNOSIS — I471 Supraventricular tachycardia, unspecified: Secondary | ICD-10-CM | POA: Diagnosis not present

## 2024-01-19 DIAGNOSIS — I491 Atrial premature depolarization: Secondary | ICD-10-CM | POA: Diagnosis not present

## 2024-01-19 DIAGNOSIS — I10 Essential (primary) hypertension: Secondary | ICD-10-CM | POA: Diagnosis not present

## 2024-01-28 DIAGNOSIS — G4733 Obstructive sleep apnea (adult) (pediatric): Secondary | ICD-10-CM | POA: Diagnosis not present

## 2024-02-15 DIAGNOSIS — I471 Supraventricular tachycardia, unspecified: Secondary | ICD-10-CM | POA: Diagnosis not present

## 2024-02-16 DIAGNOSIS — I471 Supraventricular tachycardia, unspecified: Secondary | ICD-10-CM | POA: Diagnosis not present

## 2024-03-17 ENCOUNTER — Other Ambulatory Visit: Payer: Self-pay | Admitting: Neurology

## 2024-03-17 NOTE — Telephone Encounter (Signed)
 Last seen on 03/04/23 No follow up scheduled   Pt will need to call and schedule follow up visit.

## 2024-03-19 NOTE — Progress Notes (Signed)
 Jack Lawson                                          MRN: [id number]   03/19/2024   The [facility] Quality Team Specialist reviewed this patient medical record for the purposes of chart review for care gap closure. The following were reviewed: chart review for care gap closure-controlling blood pressure.    [facility] Quality Team

## 2024-03-23 ENCOUNTER — Encounter: Payer: Self-pay | Admitting: Neurology

## 2024-03-23 NOTE — Telephone Encounter (Signed)
 Pt called to Follow up about  Medication  request , Informed Pt need to schedule  appt . Appt Scheduled

## 2024-03-24 MED ORDER — DULOXETINE HCL 60 MG PO CPEP: 60.0000 mg | ORAL_CAPSULE | Freq: Every day | ORAL | 1 refills | Status: AC

## 2024-05-06 ENCOUNTER — Ambulatory Visit: Admitting: Neurology

## 2024-05-13 ENCOUNTER — Encounter: Admitting: Family Medicine
